# Patient Record
Sex: Male | Born: 1990 | Race: Black or African American | Hispanic: No | Marital: Single
Health system: Southern US, Academic
[De-identification: ages and names within clinical notes are randomized; demographics above are authoritative.]

## PROBLEM LIST (undated history)

## (undated) ENCOUNTER — Telehealth

## (undated) ENCOUNTER — Encounter

## (undated) ENCOUNTER — Encounter: Attending: Neurology | Primary: Neurology

## (undated) ENCOUNTER — Ambulatory Visit: Payer: MEDICARE | Attending: Sports Medicine | Primary: Sports Medicine

## (undated) ENCOUNTER — Encounter: Attending: Physician Assistant | Primary: Physician Assistant

## (undated) ENCOUNTER — Ambulatory Visit

## (undated) ENCOUNTER — Ambulatory Visit: Payer: MEDICARE | Attending: Neurology | Primary: Neurology

## (undated) ENCOUNTER — Ambulatory Visit: Payer: MEDICARE

## (undated) ENCOUNTER — Ambulatory Visit: Payer: MEDICARE | Attending: Physician Assistant | Primary: Physician Assistant

## (undated) HISTORY — PX: INGUINAL HERNIA REPAIR: SUR1180

---
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
## 2013-10-29 DIAGNOSIS — G35 Multiple sclerosis: Secondary | ICD-10-CM

## 2013-10-29 DIAGNOSIS — G35D Multiple sclerosis, unspecified: Secondary | ICD-10-CM

## 2013-10-29 HISTORY — DX: Multiple sclerosis, unspecified: G35.D

## 2013-10-29 HISTORY — DX: Multiple sclerosis: G35

## 2017-05-10 ENCOUNTER — Ambulatory Visit
Admission: RE | Admit: 2017-05-10 | Discharge: 2017-05-10 | Disposition: A | Discharge status: Home / Self Care | Payer: MEDICARE

## 2017-05-10 DIAGNOSIS — G35 Multiple sclerosis: Principal | ICD-10-CM

## 2017-05-10 MED ORDER — BUPROPION HCL XL 150 MG 24 HR TABLET, EXTENDED RELEASE
ORAL_TABLET | Freq: Every day | ORAL | 2 refills | 0 days | Status: CP
Start: 2017-05-10 — End: 2017-07-19

## 2017-05-10 NOTE — Unmapped (Signed)
[facility] [employer] [facility]  Multiple Sclerosis / Neuroimmunology Division  Osie Amparo [facility]  Physician Assistant    Phone: [phone]  Fax: [phone]  ????  Patient Name: Carlos Hodges   Date of Birth: Apr 04, 1991  Medical Record Number: [id number]  [address]  [place] [address]  ??  Direct entry by:  Cy Blamer, PA-C.  Teaching Physician: Dr. Desma Mcgregor.    DATE OF VISIT: May 10, 2017    REASON FOR VISIT: Followup in the Neuroimmunology Clinic for evaluation of relapsing remitting multiple sclerosis. Last seen 01/23/2017 by Dr. Hunt Oris.    ASSESSMENT AND PLAN:  1. Relapsing Remitting Multiple sclerosis:  - Started Gilenya 02/01/2014.   -MRI of the brain 12/31/2015 compared to 03/37/2016 shows at least 3 new non enhancing lesions in the deep subcortical white matter.  -Tysabri 02/2016 -12/2016. Rigors with every infusion despite IV Benadryl 50mg  therefore discontinued.  -Started Aubagio 02/2017.    -Check CBC/d, LFT, ,  and Vitamin D 25-OH.  -Schedule re-baseline MRI's for 6 months after the start of Aubagio, November 2018.  -Set up Monthly labs., LFT's  locally for month 4 and 5.    2. Rheumatoid arthritis: Continue follow ups with Dr. Hector Shade.   3. Performed Mini Mental exam =  30/30.  4. Depression: PHQ9= 5 . Did not start Wellbutrin or go to therapy. Encouraged to do so. Sent new prescription for Wellbutrin XL 150mg  to pharmacy. Denies suicidal ideation. Advised to monitor weight.  5.  Vit D deficiency:  Will advise once today's lab results are back.    6.  Return to clinic in 3 months.  7. Total visit time =    33  Minutes. 0225/0258  Greater than 50% of the face to face time was spent in consultation and treatment planning on the  disease process, medication, dosing and side effects. MRI images reviewed personally by myself and patient.    PRIOR HISTORY:   A 26 y.o.[place] male who is here by himself. He was seen as a new patient 09/2013 and diagnosed with relapsing Remitting Multiple sclerosis. 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    He  reported loss of vision in September 2014 on the left eye which resolved after a course of intravenous Solu-Medrol over 4 days. 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  He had spinal fluid analysis that was unremarkable.     MRI REVIEW:  02/03/2017  MRI of the brain with and without contrast compared to 12/31/2015: Similar appearance of multiple periventricular and subcortical white matter T2/FLAIR hyperintensities, compatible with known history of multiple sclerosis. No enhancing lesions are seen to suggest active demyelination. ??Similar appearance of atrophic optic nerves bilaterally.    12/31/2015  MRI of the brain compared to 01/23/2015: Redemonstration of multiple periventricular and deep subcortical white matter T2/FLAIR hyperintensities, some of which are oriented perpendicular to the callosal septal interface compatible with known history of multiple sclerosis. There are at least 3 new deep subcortical white matter lesions compared to study dated 01/23/15. No enhancing lesions to suggest active demyelination.    02/03/2017 MRI of the cervical spine with and without contrast compared to 12/31/2015: Multiple areas of T2/STIR hyperintensities in the cervical spinal cord are grossly unchanged. No new or enhancing lesions are identified.     02/03/2017 MRI of the thoracic spine with and without contrast compared to 12/31/2015: No  definite enhancing lesions are seen. Numerous foci of T2/STIR signal abnormality of the thoracic cord are mostly unchanged with no obvious new lesions seen. No significant cord atrophy is identified.    MS MEDICATION:  Gilenya started 02/01/2014.  Tysabri  02/2016- 12/2016. Had rigors with each infusion     INTERVAL HISTORY:  Continues to have vertigo with  room spinning. Occurs infrequently now. Last for ten seconds. More from sitting to standing. tion.  Trouble with focusing and forgetting things. Has not improved since graduation from college.    Denies double vision, pain with eye movement or color desaturation.  Denies  inability to empty bladder. Some urgency and some leakage. Does not wear pad or depends. No UTI's. Does not want to take medications.  Denies suicidal ideation. Some  depressionDid not start Wellbutrin or go to counseling.  Patient reports no changes in work or social history.    FAMILY HISTORY:  No MS, RA or diabetes.    Review of Systems:  A 10-systems review was performed and, unless otherwise noted, declared negative by patient.    Appointment on 01/02/2017   Component Date Value Ref Range Status   ??? Sodium 01/02/2017 144  135 - 145 mmol/L Final   ??? Potassium 01/02/2017 4.8  3.5 - 5.0 mmol/L Final   ??? Chloride 01/02/2017 101  98 - 107 mmol/L Final   ??? CO2 01/02/2017 31.3* 22.0 - 30.0 mmol/L Final   ??? BUN 01/02/2017 13  7 - 21 mg/dL Final   ??? Creatinine 01/02/2017 0.74  0.70 - 1.30 mg/dL Final   ??? BUN/Creatinine Ratio 01/02/2017 18   Final   ??? EGFR MDRD Non Af Amer 01/02/2017 130  mL/min/1.88m2 Final   ??? EGFR MDRD Af Amer 01/02/2017 157  mL/min/1.14m2 Final   ??? Anion Gap 01/02/2017 11.7  9 - 15 mmol/L Final   ??? Glucose 01/02/2017 70  65 - 179 mg/dL Final   ??? Calcium 16/07/9603 10.2  8.5 - 10.2 mg/dL Final   ??? Albumin 54/06/8118 4.6  3.5 - 5.0 g/dL Final   ??? Total Protein 01/02/2017 8.1* 6.6 - 8.0 g/dL Final   ??? Total Bilirubin 01/02/2017 0.4  0.0 - 1.2 mg/dL Final   ??? AST 14/78/2956 44  19 - 55 U/L Final   ??? ALT 01/02/2017 23  19 - 72 U/L Final   ??? Alkaline Phosphatase 01/02/2017 76  38 - 126 U/L Final   ??? WBC 01/02/2017 5.0  4.5 - 11.0 10*9/L Final   ??? RBC 01/02/2017 4.58  4.50 - 5.90 10*12/L Final   ??? HGB 01/02/2017 12.6* 13.5 - 17.5 g/dL Final   ??? HCT 21/30/8657 40.9* 41.0 - 53.0 % Final   ??? MCV 01/02/2017 89.2  80.0 - 100.0 fL Final   ??? MCH 01/02/2017 27.6  26.0 - 34.0 pg Final   ??? MCHC 01/02/2017 30.9* 31.0 - 37.0 g/dL Final   ??? RDW 84/69/6295 15.5* 12.0 - 15.0 % Final   ??? MPV 01/02/2017 7.1  7.0 - 10.0 fL Final   ??? Platelet 01/02/2017 284  150 - 440 10*9/L Final   ??? Absolute Neutrophils 01/02/2017 1.9* 2.0 - 7.5 10*9/L Final   ??? Absolute Lymphocytes 01/02/2017 2.6  1.5 - 5.0 10*9/L Final   ??? Absolute Monocytes 01/02/2017 0.2  0.2 - 0.8 10*9/L Final   ??? Absolute Eosinophils 01/02/2017 0.1  0.0 - 0.4 10*9/L Final   ??? Absolute Basophils 01/02/2017 0.1  0.0 - 0.1 10*9/L Final   ??? Large  Unstained Cells 01/02/2017 4  0 - 4 % Final   ??? Hypochromasia 01/02/2017 Marked* Not Present Final   ??? Smear Review Comments 01/02/2017 See Comment* Undefined Final   Procedure visit on 12/14/2016   Component Date Value Ref Range Status   ??? Fluid Type 12/14/2016 Fluid, Joint   Final   ??? Color, Fluid 12/14/2016 Yellow   Final   ??? Appearance, Fluid 12/14/2016 Opaque   Final   ??? RBC, Fluid 12/14/2016 15000  ul Final   ??? Nucleated Cells, Fluid 12/14/2016 65000  Undefined ul Final   ??? Neutrophil %, Fluid 12/14/2016 83.0  % Final   ??? Lymphocytes %, Fluid 12/14/2016 5.0  % Final   ??? Mono/Macro % , Fluid 12/14/2016 12.0  % Final   ??? Total Cells Counted, BF Diff 12/14/2016 100   Final   ??? Crystal Analysis 12/14/2016 No crystals seen  No crystals seen Final   ??? Fluid Comments 12/14/2016    Final   ??? Joint Fluid Culture 12/14/2016 NO GROWTH   Final   ??? Gram Stain Result 12/14/2016 1+ Polymorphonuclear leukocytes   Final   ??? Gram Stain Result 12/14/2016 No organisms seen   Final   Office Visit on 11/22/2016   Component Date Value Ref Range Status   ??? Quanterferon TB Gold 01/02/2017 Negative  Negative Final   ??? Quanterferon NIL Value 01/02/2017 0.07  IU/mL Final   ??? Quantiferon Mitogen Minus Nil 01/02/2017 >10.00  IU/mL Final   ??? Quantiferon Antigen Minus Nil 01/02/2017 -0.01  IU/mL Final       PROBLEM LIST:    Patient Active Problem List   Diagnosis   ??? Rheumatoid arthritis (CMS-HCC)   ??? Temporal arteritis (CMS-HCC)   ??? Multiple sclerosis (CMS-HCC)   ??? Risk for falls   ??? High risk medication use   ??? Rigors   ??? Pain of both elbows   ??? Bilateral knee swelling   ??? Vitamin D deficiency   ??? Pain and swelling of knee, left   ??? Disorder of bone    ??? Long term current use of non-steroidal anti-inflammatories (NSAID)       Past Surgical Hx:    Past Surgical History:   Procedure Laterality Date   ??? APPENDECTOMY     ??? ELBOW SURGERY     ??? HERNIA REPAIR         Social Hx:    Social History     Social History   ??? Marital status: Single     Spouse name: N/A   ??? Number of children: N/A   ??? Years of education: N/A     Social History Main Topics   ??? Smoking status: Never Smoker   ??? Smokeless tobacco: Never Used   ??? Alcohol use No      Comment: social   ??? Drug use: No   ??? Sexual activity: Not on file     Other Topics Concern   ??? Do You Use Sunscreen? No   ??? Tanning Bed Use? No   ??? Are You Easily Burned? No   ??? Excessive Sun Exposure? No   ??? Blistering Sunburns? No     Social History Narrative    Lives with mother.    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    School , 4 classes, M-F, full time student.       Family Hx:  No family history on file.    CURRENT MEDICATIONS:  Current Outpatient Prescriptions   Medication Sig Dispense Refill   ??? buPROPion (WELLBUTRIN) 75 MG tablet Take 1 tablet (75 mg total) by mouth Three (3) times a day. 90 tablet 2   ??? cholecalciferol, vitamin D3, 4,000 unit cap Take 4,000 Units by mouth daily. 30 capsule 5   ??? teriflunomide (AUBAGIO) 14 mg Tab Take 14 mg by mouth daily. 28 tablet 5     Current Facility-Administered Medications   Medication Dose Route Frequency Provider Last Rate Last Dose   ??? famotidine (PEPCID) injection 20 mg  20 mg Intravenous Once Abbott Pao, MD           ALLERGIES:  No Known Allergies    VITAL SIGNS  BP 112/52 (BP Site: L Arm, BP Position: Sitting, BP Cuff Size: Medium)  - Pulse 63  - Ht 182.9 cm (6')  - Wt 67.4 kg (148 lb 11.2 oz)  - BMI 20.17 kg/m??     PHYSICAL EXAMINATION:  GENERAL:  Alert and oriented to person, place, time and situation.    Recent and remote memory intact.      Neurological Examination:     Cranial Nerves:   II, III- Pupils are equal 3 mm and reactive to light b/l.  III, IV, VI- extra ocular movements are intact, No ptosis, no nystagmus.  V- sensation of the face intact b/l.  VII- face symmetrical, no facial droop, normal facial movements with smile/grimace  VIII- Hearing grossly intact.  IX and X- symmetric palate contraction, normal gag bilaterally  XI- Full shoulder shrug bilaterally  XII- Tongue protrudes midline, full range of movements of the tongue    Motor Exam:   ??  Muscles UEs ?? LEs   ?? R L ?? R L   Deltoids 5/5 5/5 Hip flexors  5/5 5/5   Biceps 5/5 5/5 Hip extensors 5/5 5/5   Triceps 5/5 5/5 Knee flexors 5/5 5/5   Hand grip 5/5 5/5 Knee extensors 5/5 5/5   Wrist flexors 5/5 5/5 Foot dorsal flexors 5/5 5/5   Wrist extensors 5/5 5/5 Foot plantar flexors 5/5 5/5   Finger flexors 5/5 5/5      Finger extensors 5/5 5/5         Normal bulk and tone.  No clonus.    Reflexes R L   Brachioradialis +2 +2   Biceps +2 +2   Triceps +2 +2   Patella +2 +2   Achilles +2 +2     Negative babinski.    Sensory UEs LEs    R L R L          Pin prick WNL WNL WNL WNL   Vibration WNL WNL WNL WNL   Proprioception WNL WNL WNL WNL        Cerebellar/Coordination:  Rapid alternating movements, finger-to-nose and heel-to-shin  bilaterally demonstrates no abnormalities.    Romberg was intact with eyes closed.  No ataxia or tremors  Noted.    Gait: Normal stride and arm swing.   Able to tandem, heel, and toe gait without difficulty.     Mini mental , MMSE = 30/30.  PHQ9= 5    EDSS   02/02/2016 Visual Acuity:  with glasses, OD 20/20, OS 20/30.  Reports  Scotoma in OS.  Confrontation WNL.  Fundoscopic exam: paledisc OS.     Functional Scores, FS.  Visual 1  Brainstem 0  Pyramidal 0  Cerebellar 0  Sensory 0  Bowel/ Bladder 0  Cerebral 1  Ambulation score  0    EDSS = 1.5.  Media Information          Document Information Pictures      05/10/2017 ??2:51 PM   Attached To:   Office Visit on 05/10/17 with Yang Rack Doreatha Massed, PA   Source Information     Johari Bennetts Doreatha Massed, [place] [facility] [place] [address]

## 2017-05-14 MED ORDER — ERGOCALCIFEROL (VITAMIN D2) 1,250 MCG (50,000 UNIT) CAPSULE
ORAL_CAPSULE | ORAL | 0 refills | 0.00000 days | Status: CP
Start: 2017-05-14 — End: 2017-07-03

## 2017-05-15 NOTE — Unmapped (Signed)
Rx Vitamin D 50.000 IU/week for 8 weeks, to be followed by 4000 Iu/day.     Carlos Hodges

## 2017-05-23 NOTE — Unmapped (Signed)
[facility] Refill and Clinical Coordination Note  Medication(s): AUBAGIO    Carlos Hodges, DOB: 1990-12-13  Phone: [phone] (home) , Alternate phone contact: N/A  Shipping address: [address]  [place] [address]  Phone or address changes today?: No  All above HIPAA information verified.  Insurance changes? No    Completed refill and clinical call assessment today to schedule patient's medication shipment from the [facility] [phone]).      MEDICATION RECONCILIATION    Confirmed the medication and dosage are correct and have not changed: Yes, regimen is correct and unchanged.    Were there any changes to your medication(s) in the past month:  No, there are no changes reported at this Hodges.    ADHERENCE    Is this medicine transplant or covered by Medicare Part B? No.      Did you miss any doses in the past 4 weeks? No missed doses reported.  Adherence counseling provided? Not needed     SIDE EFFECT MANAGEMENT    Are you tolerating your medication?:  Carlos Hodges reports tolerating the medication.  Side effect management discussed: None      Therapy is appropriate and should be continued.    Evidence of clinical benefit: See Epic note from 05/10/17      FINANCIAL/SHIPPING    Delivery Scheduled: Yes, Expected medication delivery date: 05/29/17   Additional medications refilled: No additional medications/refills needed at this Hodges.    Carlos Hodges.    Delivery address validated in [facility] scheduling system: [facility] UPDATED TO NEW ADDRESS FOR DELIVERY      We will follow up with patient monthly for standard refill processing and delivery.      Thank you,  Carlos Hodges   [facility] Specialty Pharmacist

## 2017-06-17 NOTE — Unmapped (Signed)
Specialty Pharmacy Refill Coordination Note     Carlos Hodges is a 26 y.o. male contacted today regarding refills of his specialty medication(s).    Reviewed and verified with patient:      Specialty medication(s) and dose(s) confirmed: yes  Changes to medications: no  Changes to insurance: no    Medication Adherence    Patient reported X missed doses in the last month:  0  Specialty Medication:  AUBAGIO  Patient is on additional specialty medications:  No  Informant:  patient  Confirmed plan for next specialty medication refill:  delivery by pharmacy  Medication Assistance Program  Refill Coordination  Has the Patient's Contact Information Changed:  No  Is the Shipping Address Different:  No  Shipping Information  Delivery Scheduled:  Yes  Delivery Date:  06/25/17  Medications to be Shipped:  AUBAGIO          Follow-up: 3 week(s)     Westley Gambles  Specialty Pharmacy Technician

## 2017-07-15 MED ORDER — CHOLECALCIFEROL (VITAMIN D3) 100 MCG (4,000 UNIT) CAPSULE
ORAL_CAPSULE | Freq: Every day | ORAL | 11 refills | 0.00000 days | Status: CP
Start: 2017-07-15 — End: 2017-11-27

## 2017-07-16 NOTE — Unmapped (Signed)
Called patient to schedule delivery. Patient declined.    1- Patient had to relocate due to [facility]    2- Patient reports his doctor told him that if he wants to have children, he should not take this medication. He did not want to elaborate any more than that, and he was not in a place to speak freely.    3- Transferred patient to [facility] to schedule appointment to see provider.

## 2017-07-19 ENCOUNTER — Ambulatory Visit
Admission: RE | Admit: 2017-07-19 | Discharge: 2017-07-19 | Disposition: A | Discharge status: Home / Self Care | Payer: MEDICARE | Attending: Physician Assistant | Admitting: Physician Assistant

## 2017-07-19 DIAGNOSIS — G35 Multiple sclerosis: Principal | ICD-10-CM

## 2017-07-19 LAB — CBC W/ AUTO DIFF
BASOPHILS ABSOLUTE COUNT: 0.1 10*9/L (ref 0.0–0.1)
EOSINOPHILS ABSOLUTE COUNT: 0.1 10*9/L (ref 0.0–0.4)
HEMATOCRIT: 40.2 % — ABNORMAL LOW (ref 41.0–53.0)
HEMOGLOBIN: 12.7 g/dL — ABNORMAL LOW (ref 13.5–17.5)
LARGE UNSTAINED CELLS: 4 % (ref 0–4)
LYMPHOCYTES ABSOLUTE COUNT: 2 10*9/L (ref 1.5–5.0)
MEAN CORPUSCULAR HEMOGLOBIN CONC: 31.6 g/dL (ref 31.0–37.0)
MEAN CORPUSCULAR HEMOGLOBIN: 29.2 pg (ref 26.0–34.0)
MEAN PLATELET VOLUME: 7.8 fL (ref 7.0–10.0)
MONOCYTES ABSOLUTE COUNT: 0.3 10*9/L (ref 0.2–0.8)
NEUTROPHILS ABSOLUTE COUNT: 1.7 10*9/L — ABNORMAL LOW (ref 2.0–7.5)
RED BLOOD CELL COUNT: 4.35 10*12/L — ABNORMAL LOW (ref 4.50–5.90)
RED CELL DISTRIBUTION WIDTH: 13 % (ref 12.0–15.0)
WBC ADJUSTED: 4.2 10*9/L — ABNORMAL LOW (ref 4.5–11.0)

## 2017-07-19 LAB — LARGE UNSTAINED CELLS: Lab: 4

## 2017-07-19 LAB — SMEAR REVIEW

## 2017-07-19 MED ORDER — TERIFLUNOMIDE 14 MG TABLET
ORAL_TABLET | Freq: Every day | ORAL | 1 refills | 0 days | Status: CP
Start: 2017-07-19 — End: 2017-11-27

## 2017-07-19 NOTE — Unmapped (Signed)
??  ??     In case of:  ?? a suspected relapse (new symptoms or worsening existing symptoms, lasting for >24h)  OR  ?? a need for an additional appointment for other reasons     Please contact:    [facility] Neurology [facility] Desk  Phone: [phone]      OR     Ms. Webb Laws  Administrative [facility], Department of Neurology  [address], [address]     [place], [place] [address]     Phone: [phone], Fax: [phone]           Yolande Jolly [facility]    [facility] Neurology /   Multiple Sclerosis Division    [address]    [place], [place] [address]
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
## 2017-07-22 MED ORDER — METHYLPREDNISOLONE 1GM IVPB
0 refills | 0 days | Status: CP
Start: 2017-07-22 — End: 2017-11-27

## 2017-07-29 NOTE — Unmapped (Signed)
Patient called - finished IVMP on Saturday.  Concerned that he has chest tightness and arm and leg soreness and weakness that he said he didn't have before.      He was not sure what is causing this.    Phone: [phone]

## 2017-07-29 NOTE — Unmapped (Signed)
Provider paged - Recommended urgent care or ED.    Called and relayed message to patient to go to local urgent care of ED.

## 2017-07-29 NOTE — Unmapped (Signed)
Message relayed to patient.

## 2017-08-24 NOTE — Unmapped (Signed)
No show  This encounter was created in error - please disregard.

## 2017-09-16 NOTE — Unmapped (Signed)
-----   Message from Petra Kuba, CMA sent at 09/16/2017 10:36 AM EST -----  Regarding: RE: needs appt.  No option for voicemail. Put in recall/reminder. I will try to call again tomorrow.     ----- Message -----  From: Cy Blamer, PA  Sent: 09/13/2017   1:55 PM  To: , #  Subject: needs appt.                                      Patient is past due for appointment.     Please call patient and schedule appointment for an extended 60 minute visit with Dr. Johnnye Lana.

## 2017-10-11 ENCOUNTER — Ambulatory Visit
Admission: RE | Admit: 2017-10-11 | Discharge: 2017-10-11 | Disposition: A | Discharge status: Home / Self Care | Payer: MEDICARE

## 2017-10-11 DIAGNOSIS — G35 Multiple sclerosis: Principal | ICD-10-CM

## 2017-10-14 NOTE — Unmapped (Signed)
Update:  Reviewed MRI's from 10/11/2017. New lesions seen in cervical spine and Balo's effect on brain MRI.  Patient is past due for appt. with Dr. Johnnye Lana.  Clinical flare-up 07/19/2017 and treated with IV methylprednisolone.  Currently on Aubagio.  History of Juvenile RA.  Recommend to change DMT to possibly Ocrevus.    Second ATTEMPT to reach out to patient.    Patient is past due for clinic visit.  Staff notified to call and schedule appointment and my chart message and /or letter sent to patient.    Last seen in clinic 07/19/2017. Safety labs checked 04/2017.

## 2017-10-15 NOTE — Unmapped (Signed)
Per report from Oran Rein at [facility] pharmacy, patient denied refill of Aubagio as he states he was instructed to finish his current bottle 2-3 months ago then stop taking it. Per Yolande Jolly, patient was supposed to have restarted it. Given that he has an appointment with Dr. Johnnye Lana in January and he has new lesions on his MRI, therapy may be escalated at that visit so per Clara he can remain off of Aubagio.    Worthy Flank, PharmD, CPP  Clinical Pharmacist, [facility]  Phone: [phone]

## 2017-10-15 NOTE — Unmapped (Signed)
I called patient to schedule his shipment of Aubagio per Alleen Borne, however he declined a shipment. He reports about 2 or 3 months ago his doctor advised him to finish the bottle he had, and then stop taking it. I advised patient I checked with the office before I called to schedule, and would check let them know he declined.

## 2017-11-22 ENCOUNTER — Ambulatory Visit: Admit: 2017-11-22 | Discharge: 2017-11-23 | Payer: MEDICARE

## 2017-11-22 ENCOUNTER — Ambulatory Visit: Admit: 2017-11-22 | Discharge: 2017-11-22 | Payer: MEDICARE

## 2017-11-22 DIAGNOSIS — E559 Vitamin D deficiency, unspecified: Secondary | ICD-10-CM

## 2017-11-22 DIAGNOSIS — M25521 Pain in right elbow: Secondary | ICD-10-CM

## 2017-11-22 DIAGNOSIS — M25522 Pain in left elbow: Secondary | ICD-10-CM

## 2017-11-22 DIAGNOSIS — G8929 Other chronic pain: Secondary | ICD-10-CM

## 2017-11-22 DIAGNOSIS — Z9181 History of falling: Secondary | ICD-10-CM

## 2017-11-22 DIAGNOSIS — M25421 Effusion, right elbow: Secondary | ICD-10-CM

## 2017-11-22 DIAGNOSIS — M25561 Pain in right knee: Secondary | ICD-10-CM

## 2017-11-22 DIAGNOSIS — M0579 Rheumatoid arthritis with rheumatoid factor of multiple sites without organ or systems involvement: Principal | ICD-10-CM

## 2017-11-22 DIAGNOSIS — Z791 Long term (current) use of non-steroidal anti-inflammatories (NSAID): Secondary | ICD-10-CM

## 2017-11-22 DIAGNOSIS — G35 Multiple sclerosis: Secondary | ICD-10-CM

## 2017-11-22 DIAGNOSIS — M25562 Pain in left knee: Secondary | ICD-10-CM

## 2017-11-22 MED ORDER — PREDNISONE 5 MG TABLETS IN A DOSE PACK
PACK | Freq: Every day | ORAL | 0 refills | 0.00000 days | Status: CP
Start: 2017-11-22 — End: 2017-12-23

## 2017-11-22 NOTE — Unmapped (Signed)
Subjective:       Carlos Hodges is a 27 y.o. male who was referred by Dr Renato Battles for evaluation of rheumatoid arthritis.  Symptoms have been present for several years. Onset was gradual. Symptoms include afternoon fatigue, joint pain, joint swelling and morning stiffness and are moderate in nature. Patient denies eye symptoms and skin nodules. Symptoms are made worse by: cold exposure, kneeling, overhead work and raising arm over head.  Symptoms are helped by arthritis medications and rest. Associated symptoms include arthralgia, fatigue, fevers, joint pain and morning stiffness. Patient denies associated alopecia, bleeding/clotting problems, depression, fevers, memory loss, muscle weakness, nausea, new headache, nodules, oral ulcers, palpitations, pleurisy, polydypsia, polyuria, rashes/photosensitive, Raynaud's and seizures.  Overall disease activity is characterized as ongoing.  Limitation on activities include none.     His symptoms began at age 51 when he experienced a fall at the playground and injured his left elbow.  Initially,  it was thought that he had hairline fracture.  A 1 month after the fall, the patient started experiencing swelling, pain and limitation  of range of motion of the left elbow.  A year later, he started having similar symptoms in the right elbow.   Four years later, he started experiencing intermittent symptoms involving the right knee, at the same time that his left elbows stopped hurting.  Two years later in an additive fashion again, the left knee started hurting and the right elbow stopped hurting.  The knee symptoms persisted intermittently and in 2010 had recurrence of the right elbow symptoms.  He has never had involvement of the  small joints of his fingers, wrists, shoulders and neck.  He deniesinvolvement of the toes, ankles and low back.  He was treated at [facility] with intra-articularorticosteroid injection and taper of prednisone that helped hissymptoms.  He reports 1 hour morning stiffness in the left knee. He has received nonsteroidal anti-inflammatories and several corticosteroid injections.  He was treated for 1-2 years with methotrexate that was ineffective.  He was also treated with a combination of methotrexate and Enbrel during that time for 6-9 months without improvement.  Humira plus methotrexate were also tried without improvement. A trial of tacrolimus was ineffective. The patient was last see by Dr Olean Ree in April, [id number] - then all serologies including rheumatoid factor, CCP antibodies, ANA, ENA, Lyme serologies and RMSF were negative.  Review of the MRI of the left knee revealed synovitis, effusion, but no evidence for hemosiderin deposition.  Then the patient was followed by Dr Derrek Gu (rheumatologist). He was started on Xeljanz in May, 2014 with good control of his RA and no side effects, stable labs. The patient had been off of Xeljanz in a few months (didn't get refills)  The patient was diagnosed with MS in 2014 and followed by [facility]. He is on Tysabri therapy. The patient has history of rigors with the last two infusions that treated with Benadryl and steroids. He did not have any other symptoms and his rigors resolved without any residual complaints. He feels well today.  He is up to date with his physical exams, He had negative TB test.    Recent lab results showed low Vitamin D level. His BDS test was normal in November, 2017.  The patient reports no recent infections or injuries or RA flares. He is pain free today.    The patient had arthrocentesis and left knee steroid injection by Dr Margaretmary Bayley with excellent results. Synovial fluid results were  negative for crystals.    The patient was last seen in March, 2018. He had MS flare-up on 07/19/2017 and treated with IV methylprednisolone.  He was on on Aubagio, neurology recommend to change DMT to possibly Ocrevus.    Elbow Pain: Patient complains of right elbow pain. Onset of the symptoms was several months ago. Inciting event: none known. Current symptoms include locking, pain radiating to the wrist and swelling. Pain is aggravated by: grasping, lifting heavy objects, supination/pronation as when opening doors. Patient's overall course: gradually worsening. Patient has had prior elbow problems. Evaluation to date: none. Treatment to date: avoidance of offending activity, ice and OTC analgesics.    Disease History:  Seropositive:  no  Erosive:  yes    Previous therapies: Patient has had a course of DMARDs in the past.  Disease activity: low.  Functional status: assessed this visit.  Disease prognosis: good.    Previous Report(s) Reviewed: historical medical records, lab reports, office notes, radiology reports, referral letter/letters and x-ray reports     No Known Allergies  Current Outpatient Prescriptions   Medication Sig Dispense Refill   ??? cholecalciferol, vitamin D3, (VITAMIN D3) 4,000 unit cap Take 4,000 Units by mouth daily. (Patient not taking: Reported on 07/19/2017) 30 capsule 11   ??? methylPREDNISolone sodium succinate (PF), OVERFILL 10 mL in sodium chloride 0.9% 100 mL IVPB One gram methylprednisolone daily for five days for MS flare-up. To be administered at infusion center. 5 each 0   ??? teriflunomide (AUBAGIO) 14 mg Tab Take 14 mg by mouth daily. 28 tablet 1     No current facility-administered medications for this visit.      No family history on file.  Current Outpatient Prescriptions on File Prior to Visit   Medication Sig Dispense Refill   ??? cholecalciferol, vitamin D3, (VITAMIN D3) 4,000 unit cap Take 4,000 Units by mouth daily. (Patient not taking: Reported on 07/19/2017) 30 capsule 11   ??? methylPREDNISolone sodium succinate (PF), OVERFILL 10 mL in sodium chloride 0.9% 100 mL IVPB One gram methylprednisolone daily for five days for MS flare-up. To be administered at infusion center. 5 each 0   ??? teriflunomide (AUBAGIO) 14 mg Tab Take 14 mg by mouth daily. 28 tablet 1     No current facility-administered medications on file prior to visit.      Past Medical History:   Diagnosis Date   ??? Arthritis    ??? Joint pain    ??? MS (multiple sclerosis) (CMS-HCC)    ??? Rheumatoid arthritis(714.0)      Past Surgical History:   Procedure Laterality Date   ??? APPENDECTOMY     ??? ELBOW SURGERY     ??? HERNIA REPAIR       Patient Active Problem List    Diagnosis Date Noted   ??? Long term current use of non-steroidal anti-inflammatories (NSAID) 09/18/2016   ??? Risk for falls 06/07/2016   ??? High risk medication use 06/07/2016   ??? Rigors 06/07/2016   ??? Pain of both elbows 06/07/2016   ??? Bilateral knee swelling 06/07/2016   ??? Vitamin D deficiency 06/07/2016   ??? Pain and swelling of knee, left 06/07/2016   ??? Disorder of bone  06/07/2016   ??? Multiple sclerosis (CMS-HCC) 10/27/2014   ??? Rheumatoid arthritis (CMS-HCC) 10/09/2013   ??? Temporal arteritis (CMS-HCC) 10/09/2013     Social History     Social History   ??? Marital status: Single     Spouse name: N/A   ???  Number of children: N/A   ??? Years of education: N/A     Occupational History   ??? Not on file.     Social History Main Topics   ??? Smoking status: Never Smoker   ??? Smokeless tobacco: Never Used   ??? Alcohol use No      Comment: social   ??? Drug use: No   ??? Sexual activity: Not on file     Other Topics Concern   ??? Do You Use Sunscreen? No   ??? Tanning Bed Use? No   ??? Are You Easily Burned? No   ??? Excessive Sun Exposure? No   ??? Blistering Sunburns? No     Social History Narrative    Lives with mother.    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    School , 4 classes, M-F, full time student.     There were no vitals taken for this visit.    Review of Systems  A 12 point review of systems was negative except for pertinent items noted in the HPI.      Objective:    There were no vitals taken for this visit.    General Appearance:  Alert, cooperative, no distress, appears stated age   Head:  Normocephalic, without obvious abnormality, atraumatic   Eyes:  PERRL, conjunctiva/corneas clear, EOM's intact, fundi benign, both eyes   Ears:  Normal TM's and external ear canals, both ears   Nose: Nares normal, septum midline, mucosa normal, no drainage or sinus tenderness   Throat: Lips, mucosa, and tongue normal; teeth and gums normal   Neck: Supple, symmetrical, trachea midline, no adenopathy, thyroid: not enlarged, symmetric, no tenderness/mass/nodules, no carotid bruit or JVD   Back:   Symmetric, no curvature, ROM normal, no CVA tenderness   Lungs:   Clear to auscultation bilaterally, respirations unlabored   Chest Wall:  No tenderness or deformity   Heart:  Regular rate and rhythm, S1, S2 normal, no murmur, rub or gallop   Abdomen:   Soft, non-tender, bowel sounds active all four quadrants,  no masses, no organomegaly   Genitalia:  Normal male   Musculosceletal:  Small joint of the hands and feet without synovitis. Full ROM of the spine, hands, feet, hips. Right elbow effusion with limited flexion and extension.   Extremities: Extremities normal, atraumatic, no cyanosis or edema   Pulses: 2+ and symmetric   Skin: Skin color, texture, turgor normal, no rashes or lesions   Lymph nodes: Cervical, supraclavicular, and axillary nodes normal   Neurologic: Normal         Imaging    MRI of the T spine: Stable thoracic cord T2 signal abnormalities compared to study dated 01/23/15 consistent with known history of multiple sclerosis. No new or enhancing lesions.    Grossly unchanged white matter burden compared to dated 01/23/15. More conspicuous lesion within the dorsal lateral aspect of the cervical spinal cord at C3. Finding might be related to differences in technique. No new or enhancing lesions.      Lab Review    LFTS normal  CBC stable     Assessment:     Patient Active Problem List    Diagnosis Date Noted   ??? Long term current use of non-steroidal anti-inflammatories (NSAID) 09/18/2016   ??? Risk for falls 06/07/2016   ??? High risk medication use 06/07/2016   ??? Rigors 06/07/2016   ??? Pain of both elbows 06/07/2016   ??? Bilateral knee swelling 06/07/2016   ???  Vitamin D deficiency 06/07/2016   ??? Pain and swelling of knee, left 06/07/2016   ??? Disorder of bone  06/07/2016   ??? Multiple sclerosis (CMS-HCC) 10/27/2014   ??? Rheumatoid arthritis (CMS-HCC) 10/09/2013   ??? Temporal arteritis (CMS-HCC) 10/09/2013     Plan:     Orders Placed This Encounter   Procedures   ??? XR Elbow 3 Or More Views Bilateral     Standing Status:   Future     Number of Occurrences:   1     Standing Expiration Date:   11/22/2018     Order Specific Question:   Performed at     Answer:   [facility]   ??? Ambulatory referral to Orthopedic Surgery     Standing Status:   Future     Standing Expiration Date:   11/22/2018     Referral Priority:   Routine     Referral Type:   Generic Referral     Referral Reason:   Specialty Services Required     Referred to Provider:   Benjaman Kindler, MD     Number of Visits Requested:   1     Medications:continue present medication, discussed risks (side effects) and benefits of medication & encouraged to read about medication. We may need to restart Harriette Ohara therapy - we discussed potential side effects.  We discussed NSAID and steroid side effects.  Labs:  None.  Procedures: Bursa aspiration and injection not indicated at this time. and Joint aspiration and injection not indicated today.  Imaging: elbow x-rays.  Avoid heavy lifting.  Continue with Vitamin D supplementation.  Follow up with Neurology.  Referrals: Orthopedic consult for the right elbow pain and synovitis.  PT, massage, water therapy.  Discussion and guidance: Discussed importance of taking medication as directed, Discussed pros and cons and risks of joint injections, Handout given on rheumatoid arthritis, Handout given on diet and exercise, injury and fall prevention, Recommended rest for painful and swollen joints, Recommended regular exercise program once pain and swelling is decreased.  Follow-up In 3 months.  Call if worsening or not improving.  The patient does not have a documented glucocorticoid management plan within the past 12 months.

## 2017-11-22 NOTE — Unmapped (Signed)
Preventing Falls: Care Instructions  Your Care Instructions    Getting around your home safely can be a challenge if you have injuries or health problems that make it easy for you to fall. Loose rugs and furniture in walkways are among the dangers for many older people who have problems walking or who have poor eyesight. People who have conditions such as arthritis, osteoporosis, or dementia also have to be careful not to fall.  You can make your home safer with a few simple measures.  Follow-up care is a key part of your treatment and safety. Be sure to make and go to all appointments, and call your doctor if you are having problems. It's also a good idea to know your test results and keep a list of the medicines you take.  How can you care for yourself at home?  Taking care of yourself  ?? You may get dizzy if you do not drink enough water. To prevent dehydration, drink plenty of fluids, enough so that your urine is light yellow or clear like water. Choose water and other caffeine-free clear liquids. If you have kidney, heart, or liver disease and have to limit fluids, talk with your doctor before you increase the amount of fluids you drink.  ?? Exercise regularly to improve your strength, muscle tone, and balance. Walk if you can. Swimming may be a good choice if you cannot walk easily.  ?? Have your vision and hearing checked each year or any time you notice a change. If you have trouble seeing and hearing, you might not be able to avoid objects and could lose your balance.  ?? Know the side effects of the medicines you take. Ask your doctor or pharmacist whether the medicines you take can affect your balance. Sleeping pills or sedatives can affect your balance.  ?? Limit the amount of alcohol you drink. Alcohol can impair your balance and other senses.  ?? Ask your doctor whether calluses or corns on your feet need to be removed. If you wear loose-fitting shoes because of calluses or corns, you can lose your balance and fall.  ?? Talk to your doctor if you have numbness in your feet.  Preventing falls at home  ?? Remove raised doorway thresholds, throw rugs, and clutter. Repair loose carpet or raised areas in the floor.  ?? Move furniture and electrical cords to keep them out of walking paths.  ?? Use nonskid floor wax, and wipe up spills right away, especially on ceramic tile floors.  ?? If you use a walker or cane, put rubber tips on it. If you use crutches, clean the bottoms of them regularly with an abrasive pad, such as steel wool.  ?? Keep your house well lit, especially stairways, porches, and outside walkways. Use night-lights in areas such as hallways and bathrooms. Add extra light switches or use remote switches (such as switches that go on or off when you clap your hands) to make it easier to turn lights on if you have to get up during the night.  ?? Install sturdy handrails on stairways.  ?? Move items in your cabinets so that the things you use a lot are on the lower shelves (about waist level).  ?? Keep a cordless phone and a flashlight with new batteries by your bed. If possible, put a phone in each of the main rooms of your house, or carry a cell phone in case you fall and cannot reach a phone. Or, you can wear  a device around your neck or wrist. You push a button that sends a signal for help.  ?? Wear low-heeled shoes that fit well and give your feet good support. Use footwear with nonskid soles. Check the heels and soles of your shoes for wear. Repair or replace worn heels or soles.  ?? Do not wear socks without shoes on wood floors.  ?? Walk on the grass when the sidewalks are slippery. If you live in an area that gets snow and ice in the winter, sprinkle salt on slippery steps and sidewalks.  Preventing falls in the bath  ?? Install grab bars and nonskid mats inside and outside your shower or tub and near the toilet and sinks.  ?? Use shower chairs and bath benches.  ?? Use a hand-held shower head that will allow you to sit while showering.  ?? Get into a tub or shower by putting the weaker leg in first. Get out of a tub or shower with your strong side first.  ?? Repair loose toilet seats and consider installing a raised toilet seat to make getting on and off the toilet easier.  ?? Keep your bathroom door unlocked while you are in the shower.  Where can you learn more?  Go to [facility] at [email or web address]-fletcher.info/.  Select Preferences in the upper right hand corner, then select Health Library under Resources. Enter G117 in the search box to learn more about Preventing Falls: Care Instructions.  Current as of: January 10, 2017  Content Version: 11.9  ?? 2006-2018 [employer]. Care instructions adapted under license by [facility]. If you have questions about a medical condition or this instruction, always ask your healthcare professional. [employer] disclaims any warranty or liability for your use of this information.

## 2017-11-27 ENCOUNTER — Ambulatory Visit: Admit: 2017-11-27 | Discharge: 2017-11-28 | Payer: MEDICARE | Attending: Neurology | Primary: Neurology

## 2017-11-27 DIAGNOSIS — G35 Multiple sclerosis: Principal | ICD-10-CM

## 2017-11-27 DIAGNOSIS — M1990 Unspecified osteoarthritis, unspecified site: Secondary | ICD-10-CM

## 2017-11-27 DIAGNOSIS — R892 Abnormal level of other drugs, medicaments and biological substances in specimens from other organs, systems and tissues: Secondary | ICD-10-CM

## 2017-11-27 DIAGNOSIS — E559 Vitamin D deficiency, unspecified: Secondary | ICD-10-CM

## 2017-11-27 MED ORDER — CHOLECALCIFEROL (VITAMIN D3) 100 MCG (4,000 UNIT) CAPSULE
ORAL_CAPSULE | Freq: Every day | ORAL | 11 refills | 0.00000 days | Status: CP
Start: 2017-11-27 — End: 2018-05-23

## 2017-11-28 NOTE — Unmapped (Signed)
[facility] at [facility]  Multiple Sclerosis/Neuroimmunology Division  Sarita Bottom, MD  Associate Professor of Neurology    DATE OF VISIT: 11/27/2017    Re:  Carlos Hodges  [address]  [place] [address]  MRN: [id number]  DOB: [birth year]      Direct entry by: Dr. Sarita Bottom    Visit: First Visit      REASON FOR VISIT: Carlos Hodges, a 26 y.o. African American right handed male, is seen in consultation at the [facility], Multiple Sclerosis/Neuroimmunology Division for the evaluation of multiple sclerosis.    Carlos Hodges was last time seen at the  at the [facility], Multiple Sclerosis/Neuroimmunology Division by Yolande Jolly, PA, on 07/19/2017.    Assessment:     1. I took a detailed history of the present illness fromMr. Carlos Hodges , details on past medical history, family history and social history.  2. I personally reviewed  patient's prior medical records, radiology reports, and laboratory work.    3. I personally reviewed the patient???s prior MRI images  and have discussed  MRI findings with the patient.   4.  I performed neurological examination.  7. The differential diagnosis and the plan for the diagnostic work-up were discussed in details with the patient. Carlos Hodges agreed with the recommended diagnostic plan  8. Potential treatment options have been  discussed with Carlos Hodges, who agreed with the recommended treatment plan.                                                                                                                                                  ?? Multiple sclerosis:  Carlos Hodges, is a 27 y.o. 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  His disease course is RR and disease evolution since 06/2013. His 09/2017 cervical MRI shows new lesions, compared to 01/2017, indicating disease activity. The patient discontinued Aubagio in 06/2017 due to the pregnancy plans of his partner. I Rx Cholestyramine for the rapid elimination of Aubagio, and would recommend Rituximab as the next treatment option, to treat both MS and arthritis. The patient agreed. Would discuss this plan with rheumatology. Please see the detailed plan below.     ?? Juvenile arthritis/RA:  Followed at rheumatology.  Per 11/22/2017 note by Dr. Hector Shade He has received nonsteroidal anti-inflammatories and several corticosteroid injections. ??He was treated for 1-2 years with methotrexate that was ineffective. ??He was also treated with a combination of methotrexate and Enbrel during that time for 6-9 months without improvement. ??Humira plus methotrexate were also tried without  improvement. A trial of tacrolimus was ineffective. The patient was last see by Dr Olean Ree in April, 2011 - then all serologies including rheumatoid factor, CCP antibodies, ANA, ENA, Lyme serologies and RMSF were negative. ??Review of the MRI of the left knee revealed synovitis, effusion, but no evidence for hemosiderin deposition.  Then the patient was followed by Dr Derrek Gu (rheumatologist). He was started on Xeljanz in May, 2014 with good control of his RA and no side effects, stable labs. The patient had been off of Xeljanz in a few months (didn't get refills). Rheumatology is currently prescribing steroids.   ?? Vitamin D deficiency:  Continue Vitamin D 4000 IU/day.     Plan:     1. CBC/diff, CMP, NMO-IgG, HBs Ag, HBc Ab, HCV Ab, HIV, quantiferon: To do the labs ordered today with the PCP (lab closed at the end of this visit).    2. recommend a flu shot.  3.  We discussed to start on Rituximab, to treat both RA and MS. We will schedule it if no contraindications (after receiving ordered lab results): 1000 mg IV on day 1, 1000 mg IV on Day 15, followed by 1000 mg IV every 6 months. It can be started 6 weeks after he would  receive  a flu shot. Get a feedback on this treatment  strategy from Dr. Hector Shade.  4. Continue with Vitamin D 4000 IU/day.  5. Rx cholestyramine for a rapid elimination of Aubagio: the patient's partner has pregnancy plans  6. Follow-up wiith Clara Zelasky 3 months after the start of Rituximab, or earlier, if needed. Consider urology referral.   7. Follow-up with the PCP. To please evaluate  Depression with the PCP.       Subjective:     CHIEF COMPLAINTS: bladder control problems, mild cognitive problems, vertigo ???on and off???, anxiety, depression, loss of appetite    HISTORY OF PRESENT ILLNESS:  Carlos Hodges, is a 27 y.o. African American right handed male with a history of MS (multiple sclerosis) and RA.     His neurological symptoms started in  September 2014, when he got left hand numbness and left optic neuritis, Received IV steroids and his vision returned but not completely. Has bladder control problems over the last 4-5 years (urgency, hesitancy, false alarms). Has not seen urologist. Has mild cognitive problems, cognitive fatigue and sometimes forgets things. Denies headaches. Has vertigo ???on and off???, tingling on and off. He reports that in the last year he had two flares for which he received IV steroids- around 11/2016 and 06/2017, for both he stated that he had numbness in legs and arms and leg weakness, he stated that he received steroids both times and recovered. He has anxiety, depression, loss of appetite, over the past year. Works from home, but has short breaks that are not enough for him and asks if he could get a letter stating that he needs more breaks during the day work time.   He does not take Vitamin D although it was prescribed.   MS DMD history:  Gilenya started 02/01/2014. MRI of the brain 12/31/2015 compared to 03/37/2016 shows at least 3 new non enhancing lesions in the deep subcortical white matter.  Tysabri  02/2016- 12/2016. Had ???rigors??? with each infusion therefore discontinued.  Aubagio  02/2017- 06/2017- stopped because he would like to have a  child soon. He is off MS DMD drugs since then.    Please see below the results of the diagnostic  work-up performed so far.   ............................................................................................................................................Marland Kitchen  DIAGNOSTIC STUDIES / REVIEW OF RECORDS:    MRI:  10/11/2017: Brain MRI with and w/o contrast, report (COMPARISON: MRI brain 02/03/2017, 12/31/2015): -Multiple white matter lesions in a distribution consistent with multiple sclerosis as well as diffuse T2 plateau pallor and global atrophy with questionable Ballo's sclerosis lesion in the right vertex. No enhancing lesion.-Bilateral optic nerve atrophy, similar to prior  10/11/2017: C spine MRI with and w/o contrast, report(COMPARISON: Cervical spine MRI 02/03/2017 as well as additional priors): New and increased size of multiple T2 hyperintense lesions throughout the cervical spinal cord. No enhancing lesions.  10/11/2017: T spine MRI with and w/o contrast, report (COMPARISON: Thoracic spine MRI 02/03/2017 as well as additional priors): -No significant change in multiple thoracic spinal cord lesions. No definitive new or enhancing lesions.  Lumbar puncture:  Per notes by Dr. Joanne Gavel on 10/05/2013 he had spinal fluid analysis that was unremarkable. No results available fo my review.     Blood tests:     Office Visit on 07/19/2017   Component Date Value Ref Range Status   ??? WBC 07/19/2017 4.2* 4.5 - 11.0 10*9/L Final   ??? RBC 07/19/2017 4.35* 4.50 - 5.90 10*12/L Final   ??? HGB 07/19/2017 12.7* 13.5 - 17.5 g/dL Final   ??? HCT 16/07/9603 40.2* 41.0 - 53.0 % Final   ??? MCV 07/19/2017 92.5  80.0 - 100.0 fL Final   ??? MCH 07/19/2017 29.2  26.0 - 34.0 pg Final   ??? MCHC 07/19/2017 31.6  31.0 - 37.0 g/dL Final   ??? RDW 54/06/8118 13.0  12.0 - 15.0 % Final   ??? MPV 07/19/2017 7.8  7.0 - 10.0 fL Final   ??? Platelet 07/19/2017 480* 150 - 440 10*9/L Final   ??? Absolute Neutrophils 07/19/2017 1.7* 2.0 - 7.5 10*9/L Final   ??? Absolute Lymphocytes 07/19/2017 2.0  1.5 - 5.0 10*9/L Final   ??? Absolute Monocytes 07/19/2017 0.3  0.2 - 0.8 10*9/L Final   ??? Absolute Eosinophils 07/19/2017 0.1  0.0 - 0.4 10*9/L Final   ??? Absolute Basophils 07/19/2017 0.1  0.0 - 0.1 10*9/L Final   ??? Large Unstained Cells 07/19/2017 4  0 - 4 % Final   ??? Hypochromasia 07/19/2017 Marked* Not Present Final   ??? Smear Review Comments 07/19/2017 See Comment* Undefined Final   Office Visit on 05/10/2017   Component Date Value Ref Range Status   ??? Albumin 05/10/2017 4.6  3.5 - 5.0 g/dL Final   ??? Total Protein 05/10/2017 7.5  6.5 - 8.3 g/dL Final   ??? Total Bilirubin 05/10/2017 0.7  0.0 - 1.2 mg/dL Final   ??? Bilirubin, Direct 05/10/2017 0.30  0.00 - 0.40 mg/dL Final   ??? AST 14/78/2956 78* 19 - 55 U/L Final   ??? ALT 05/10/2017 40  19 - 72 U/L Final   ??? Alkaline Phosphatase 05/10/2017 61  38 - 126 U/L Final   ??? Vitamin D Total (25OH) 05/10/2017 13.0* 20.0 - 80.0 ng/mL Final   ??? Results Verified by Slide Scan 05/10/2017 Slide Reviewed   Final   ??? WBC 05/10/2017 3.1* 4.5 - 11.0 10*9/L Final   ??? RBC 05/10/2017 4.48* 4.50 - 5.90 10*12/L Final   ??? HGB 05/10/2017 13.3* 13.5 - 17.5 g/dL Final   ??? HCT 21/30/8657 41.0  41.0 - 53.0 % Final   ??? MCV 05/10/2017 91.5  80.0 - 100.0 fL Final   ??? MCH 05/10/2017 29.7  26.0 - 34.0  pg Final   ??? MCHC 05/10/2017 32.5  31.0 - 37.0 g/dL Final   ??? RDW 14/78/2956 14.8  12.0 - 15.0 % Final   ??? MPV 05/10/2017 7.6  7.0 - 10.0 fL Final   ??? Platelet 05/10/2017 289  150 - 440 10*9/L Final   ??? Neutrophil Left Shift 05/10/2017 2+* Not Present Final   ??? Absolute Neutrophils 05/10/2017 1.0* 2.0 - 7.5 10*9/L Final   ??? Absolute Lymphocytes 05/10/2017 1.7  1.5 - 5.0 10*9/L Final   ??? Absolute Monocytes 05/10/2017 0.1* 0.2 - 0.8 10*9/L Final   ??? Absolute Eosinophils 05/10/2017 0.2  0.0 - 0.4 10*9/L Final   ??? Absolute Basophils 05/10/2017 0.1  0.0 - 0.1 10*9/L Final   ??? Large Unstained Cells 05/10/2017 3  0 - 4 % Final   ??? Hypochromasia 05/10/2017 Slight* Not Present Final   Appointment on 01/02/2017   Component Date Value Ref Range Status   ??? Sodium 01/02/2017 144  135 - 145 mmol/L Final   ??? Potassium 01/02/2017 4.8  3.5 - 5.0 mmol/L Final   ??? Chloride 01/02/2017 101  98 - 107 mmol/L Final   ??? CO2 01/02/2017 31.3* 22.0 - 30.0 mmol/L Final   ??? BUN 01/02/2017 13  7 - 21 mg/dL Final   ??? Creatinine 01/02/2017 0.74  0.70 - 1.30 mg/dL Final   ??? BUN/Creatinine Ratio 01/02/2017 18   Final   ??? EGFR MDRD Non Af Amer 01/02/2017 130  mL/min/1.53m2 Final   ??? EGFR MDRD Af Amer 01/02/2017 157  mL/min/1.24m2 Final   ??? Anion Gap 01/02/2017 11.7  9 - 15 mmol/L Final   ??? Glucose 01/02/2017 70  65 - 179 mg/dL Final   ??? Calcium 21/30/8657 10.2  8.5 - 10.2 mg/dL Final   ??? Albumin 84/69/6295 4.6  3.5 - 5.0 g/dL Final   ??? Total Protein 01/02/2017 8.1* 6.6 - 8.0 g/dL Final   ??? Total Bilirubin 01/02/2017 0.4  0.0 - 1.2 mg/dL Final   ??? AST 28/41/3244 44  19 - 55 U/L Final   ??? ALT 01/02/2017 23  19 - 72 U/L Final   ??? Alkaline Phosphatase 01/02/2017 76  38 - 126 U/L Final   ??? WBC 01/02/2017 5.0  4.5 - 11.0 10*9/L Final   ??? RBC 01/02/2017 4.58  4.50 - 5.90 10*12/L Final   ??? HGB 01/02/2017 12.6* 13.5 - 17.5 g/dL Final   ??? HCT 10/31/7251 40.9* 41.0 - 53.0 % Final   ??? MCV 01/02/2017 89.2  80.0 - 100.0 fL Final   ??? MCH 01/02/2017 27.6  26.0 - 34.0 pg Final   ??? MCHC 01/02/2017 30.9* 31.0 - 37.0 g/dL Final   ??? RDW 66/44/0347 15.5* 12.0 - 15.0 % Final   ??? MPV 01/02/2017 7.1  7.0 - 10.0 fL Final   ??? Platelet 01/02/2017 284  150 - 440 10*9/L Final   ??? Absolute Neutrophils 01/02/2017 1.9* 2.0 - 7.5 10*9/L Final   ??? Absolute Lymphocytes 01/02/2017 2.6  1.5 - 5.0 10*9/L Final   ??? Absolute Monocytes 01/02/2017 0.2  0.2 - 0.8 10*9/L Final   ??? Absolute Eosinophils 01/02/2017 0.1  0.0 - 0.4 10*9/L Final   ??? Absolute Basophils 01/02/2017 0.1  0.0 - 0.1 10*9/L Final   ??? Large Unstained Cells 01/02/2017 4  0 - 4 % Final   ??? Hypochromasia 01/02/2017 Marked* Not Present Final   ??? Smear Review Comments 01/02/2017 See Comment* Undefined Final     .............................................................................................................................................    Past  Medical History:  Past Medical History:   Diagnosis Date   ??? Arthritis    ??? Joint pain    ??? MS (multiple sclerosis) (CMS-HCC)    ??? Rheumatoid arthritis(714.0)        ALLERGIES:  No Known Allergies    CURRENT MEDICATIONS:    Current Outpatient Prescriptions   Medication Sig Dispense Refill   ??? predniSONE (DELTASONE) 5 mg DsPk Take by mouth daily. Taper from 30 mg down to zero. 1 Package 0   ??? cholecalciferol, vitamin D3, (VITAMIN D3) 4,000 unit cap Take 4,000 Units by mouth daily. 30 capsule 11   ??? cholestyramine-aspartame 4 gram Powd Take 8 g by mouth Three (3) times a day. Over 11 days. 264 g 0     No current facility-administered medications for this visit.            Past Surgical History:    Past Surgical History:   Procedure Laterality Date   ??? APPENDECTOMY     ??? ELBOW SURGERY     ??? HERNIA REPAIR         Social History:    Social History     Social History   ??? Marital status: Single     Spouse name: N/A   ??? Number of children: N/A   ??? Years of education: N/A     Social History Main Topics   ??? Smoking status: Never Smoker   ??? Smokeless tobacco: Never Used   ??? Alcohol use No      Comment: social   ??? Drug use: No   ??? Sexual activity: Not Asked     Other Topics Concern   ??? Do You Use Sunscreen? No   ??? Tanning Bed Use? No   ??? Are You Easily Burned? No   ??? Excessive Sun Exposure? No   ??? Blistering Sunburns? No     Social History Narrative    Lives with mother.    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    School , 4 classes, M-F, full time student.       Family History:  No family history on file.     Review of Systems:  A 10-systems review was performed and, unless otherwise noted, declared negative by patient.    Objective:     Physical Exam:  Blood pressure 123/63, pulse 52, height 182.9 cm (6' 0.01), weight 68.8 kg (151 lb 9.6 oz).   General Appearance: in no acute distress. Normal skin color, afebrile.  Heart and lungs: Regular heart rate. Eupneic, normal respiratory rate. Abdomen: Soft, non-tender.No peripheral  edema, peripheral pulses palpable.       NEUROLOGICAL EXAMINATION:     General:  Alert and oriented to person, place, time and situation.    Recent and remote memory intact.    Attention span and concentration normal.    Language and spontaneous speech normal, no dysarthria or aphasia. Naming/fluency/repetition intact.  Fund of knowledge normal.  Following lateralizing commands across midline.       Cranial Nerves:     II, III- Pupils are equal and reactive to light b/l (direct and consensual reactions). Visual Acuity: R 20/20,L 20/50 with Glasses. No visual field defect. Fundoscopy: clear optic disc margins, bilateral optic disc pallor.   III, IV, VI- extra ocular movements are intact, No ptosis, no diplopia, no nystagmus.  V- sensation of the face intact b/l.   VII- face symmetrical, no facial droop, normal facial movements with smile/grimace  VIII-  Hearing grossly intact. Weber test: sound is symmetrical with no lateralization.  IX and X- symmetric palate contraction, normal gag bilaterally, no dysarthria, no dysphagia.  XI- Full shoulder shrug bilaterally; no wasting, normal tone and strength of sternocleidomastoid muscles bilaterally.  XII- No tongue atrophy, no tongue fasciculations; tongue protrudes midline, full range of movements of the tongue.    Neck flexion normal.    Motor Exam:     Normal bulk. Fasciculations not observed.     Muscle strength:    Muscles UEs  LEs    R L  R L   Deltoids 5/5 5/5 Hip flexors  5/5 5/5   Biceps 5/5 5/5 Hip extensors 5/5 5/5   Triceps 5/5 5/5 Knee flexors 5/5 5/5   Hand grip 5/5 5/5 Knee extensors 5/5 5/5   Wrist flexors 5/5 5/5 Foot dorsal flexors 5/5 5/5   Wrist extensors 5/5 5/5 Foot plantar flexors 5/5 5/5   Finger flexors 5/5 5/5      Finger extensors 5/5 5/5          Reflexes R L   Biceps +2 +2   Brachioradialis  +2 +2   Triceps +2 +2   Patella +2 +2   Achilles +2 +2     Normal tone b/l. Negative Babinski sign bilaterally.    Sensory system:  ? Superficial light touch sensation:WNl  ? Vibration sense:mild decrease in vibrations in both legs  ? Position sense: WNL  ? Pinprick test for pain sensation:WNL  (WNL= within normal limits; UE= upper extremities; LE= lower extremities;       R= right, L= left).    Cerebellar/Coordination:  Rapid alternating movements, finger-to-nose and heel-to-shin  bilaterally demonstrate no abnormalities. Mild left leg ataxia, no gait ataxia. Romberg negative. Performs a tandem gait.    Gait: Normal stride, base and  armswing. Able to walk on toes, heels without difficulty.       .........................................................................................................................................Marland Kitchen    VISIT SUMMARY:  Carlos [facility]. Damonie Ellenwood, a 27 y.o. male  presented for the evaluation of MS. Carlos Hodges. Jeramey Lanuza is instructed to schedule a follow-up with Yolande Jolly, PA 3 months after the start of Rituximab, or earlier, if needed.   Carlos [facility]ges. Maxton Noreen [facility] voiced a complete understanding of the diagnostic and treatment plan as detailed above. All questions were answered.   Start of Visit Time: 17:01h  End of Visit Time: 17:56h  Total visit time =  55  Minutes+ additional 10 minutes for my MRI review: total visit time 65 minutes.     Greater than 50% of the face to face time was spent in consultation on the  disease process, and treatment planning, medication, dosing and side effects.     Thank you for the opportunity to contribute to the care of Carlos Hodges. [facility].

## 2017-11-28 NOTE — Unmapped (Addendum)
1) we wold recommend a flu shot.  2) We discussed to start on Rituximab, to treat both RA and MS. We will schedule it if no contraindications. It can be started 6 weeks after you would receive  a flu shot.   3) Please do the labs ordered today with your PCP   4) we will send you a prescription for a pill for a rapid elimination of Aubagio  5) Please continue with Vitamin D 4000 IU/day.   6)  Please schedule with Clara Zelasky 3 months after the start of Rituximab, or earlier, if needed.     ?   In case of:  ? a suspected relapse (new symptoms or worsening existing symptoms, lasting for >24h)  OR  ? a need for an additional appointment for other reasons     Please contact:    [facility]   Phone: [phone]           Sarita Bottom, MD  Clinical Associate Professor of Neurology  [facility], Department of Neurology  Multiple Sclerosis/Neuroimmunology Division  [address] [place], [place], [place] [address]

## 2017-12-01 MED ORDER — CHOLESTYRAMINE-ASPARTAME 4 GRAM ORAL POWDER
Freq: Three times a day (TID) | ORAL | 0 refills | 0.00000 days | Status: CP
Start: 2017-12-01 — End: 2018-05-23

## 2017-12-05 ENCOUNTER — Ambulatory Visit: Admit: 2017-12-05 | Discharge: 2017-12-06 | Payer: MEDICARE

## 2017-12-05 DIAGNOSIS — M05721 Rheumatoid arthritis with rheumatoid factor of right elbow without organ or systems involvement: Principal | ICD-10-CM

## 2017-12-05 DIAGNOSIS — M19029 Primary osteoarthritis, unspecified elbow: Secondary | ICD-10-CM

## 2017-12-05 DIAGNOSIS — M05722 Rheumatoid arthritis with rheumatoid factor of left elbow without organ or systems involvement: Secondary | ICD-10-CM

## 2017-12-05 NOTE — Unmapped (Signed)
Clinic location: [place]     ASSESSMENT:       ICD-10-CM   1. 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  Follow-up with Dr. Margaretmary Bayley to have intra-articular steroid injections of bilateral elbows for some dramatic relief.  Follow-up with me in clinic for persistent or worsening symptoms.     SUBJECTIVE:     HPI: Carlos Hodges is a 27 y.o. male RHD with RA and MS complaining of bilateral elbow pain.  Established patient of Dr. Hector Shade and arthritic symptoms are treated with Aleve as needed, and had a recent short course of prednisone which has helped most of his swelling.  However symptoms have persisted.  Most recent exacerbation 2-3 weeks.  Is previously had surgery on bilateral elbows, sounds like I&D.    Is seen in consultation at the request of Dr. Hector Shade for swelling of the right elbow and rheumatoid arthritis.     Past Medical History:   Diagnosis Date   ??? Arthritis    ??? Joint pain    ??? MS (multiple sclerosis) (CMS-HCC)    ??? Rheumatoid arthritis(714.0)       Past Surgical History:   Procedure Laterality Date   ??? APPENDECTOMY     ??? ELBOW SURGERY     ??? HERNIA REPAIR        No Known Allergies   Medications   Current Outpatient Prescriptions on File Prior to Visit   Medication Sig   ??? cholecalciferol, vitamin D3, (VITAMIN D3) 4,000 unit cap Take 4,000 Units by mouth daily.   ??? cholestyramine-aspartame 4 gram Powd Take 8 g by mouth Three (3) times a day. Over 11 days.   ??? predniSONE (DELTASONE) 5 mg DsPk Take by mouth daily. Taper from 30 mg down to zero.     No current facility-administered medications on file prior to visit.       No family history on file.   Social History     Social History Narrative    Lives alone in [place].    Disabled due to RA and MS    School , 4 classes, M-F, full time student.    Enjoys reading     Social History   Substance Use Topics   ??? Smoking status: Never Smoker   ??? Smokeless tobacco: Never Used   ??? Alcohol use No      Comment: social      Review of Systems  Review of systems was negative except for pertinent items noted in the HPI and the below:  .       PHYSICAL EXAM:     Pain: 0/10.  Vitals:    12/05/17 0906   Pulse: 53   Temp: 36.8 ??C (98.2 ??F)   TempSrc: Oral   SpO2: 93%   Weight: 67.1 kg (147 lb 14.4 oz)      General: Well developed, well nourished, in no acute distress.   Psych: Alert & oriented x 3.  ENT: Hearing normal  Neuro: Sensation intact to bilateral upper extremities  Cardiovascular: Cap refill brisk.  Moderate right elbow effusion.  Mild left elbow effusion.  Respiratory: No evidence of respiratory distress  MSK: Right upper extremity: Elbow range of motion 0-90, hard stop.  Mildly TTP diffuse  lateral aspect of the elbow.  Nontender olecranon and medial epicondyle.  Positive EPL, okay, interosseous.  Wrist range of motion equal, full, painless.  Strength 5/5.  Left upper extremity: Elbow range of motion equal, full, painless.  Positive EPL, okay, interosseous.  Nontender to palpation medial, lateral epicondyles, olecranon.  Wrist range of motion equal, full, painless.  Skin: Incisions bilateral elbows well-healed, no surrounding erythema or drainage.     MEDICAL DECISION MAKING:  Reviewed x-rays of the bilateral elbows from 11/22/2017.  Moderate to severe degenerative changes right elbow, mild to moderate degenerative changes left elbow.  No repeat x-rays today.     cc:  [facility]    Lavonda Jumbo, MD    Carlos Hodges M. Haskel Khan, FNP-BC  [phone] (main)

## 2017-12-05 NOTE — Unmapped (Signed)
Joint Injections: Care Instructions  Your Care Instructions    Joint injections are shots into a joint, such as the knee. They may be used to put in medicines, such as pain relievers.  A corticosteroid, or steroid, shot is used to reduce inflammation in tendons or joints. It is often used to treat problems such as arthritis, tendinitis, and bursitis.  Steroids can be injected directly into a painful, inflamed joint. They can also help reduce inflammation of a bursa. A bursa is a sac of fluid. It cushions and lubricates areas where tendons, ligaments, skin, muscles, or bones rub against each other.  A steroid shot can sometimes help with short-term pain relief when other treatments haven't worked. If steroid shots help, pain may improve for weeks or months.  Follow-up care is a key part of your treatment and safety. Be sure to make and go to all appointments, and call your doctor if you are having problems. It's also a good idea to know your test results and keep a list of the medicines you take.  How can you care for yourself at home?  ?? Put ice or a cold pack on the area for 10 to 20 minutes at a time. Put a thin cloth between the ice and your skin.  ?? Ask your doctor if you can take an over-the-counter pain medicine, such as acetaminophen (Tylenol), ibuprofen (Advil, Motrin), or naproxen (Aleve). Be safe with medicines. Read and follow all instructions on the label.  ?? Avoid strenuous activities for several days. In particular, avoid ones that put stress on the area where you got the shot.  ?? If you have dressings over the area, keep them clean and dry. You may remove them when your doctor tells you to.  When should you call for help?  Call your doctor now or seek immediate medical care if:  ?? ?? You have signs of infection, such as:  ? Increased pain, swelling, warmth, or redness.  ? Red streaks leading from the site.  ? Pus draining from the site.  ? A fever.   ??Watch closely for changes in your health, and be sure to contact your doctor if you have any problems.  Where can you learn more?  Go to [facility] at [email or web address]  Select Preferences in the upper right hand corner, then select Health Library under Resources. Enter N616 in the search box to learn more about Joint Injections: Care Instructions.  Current as of: July 18, 2017  Content Version: 11.9  ?? 2006-2018 [employer]. Care instructions adapted under license by [facility]. If you have questions about a medical condition or this instruction, always ask your healthcare professional. [employer] disclaims any warranty or liability for your use of this information.

## 2017-12-23 MED ORDER — PREDNISONE 5 MG TABLETS IN A DOSE PACK
PACK | Freq: Every day | ORAL | 0 refills | 0 days | Status: CP
Start: 2017-12-23 — End: 2018-04-21

## 2018-01-06 ENCOUNTER — Ambulatory Visit: Admit: 2018-01-06 | Discharge: 2018-01-06 | Payer: MEDICARE

## 2018-01-06 DIAGNOSIS — M0579 Rheumatoid arthritis with rheumatoid factor of multiple sites without organ or systems involvement: Secondary | ICD-10-CM

## 2018-01-06 DIAGNOSIS — M25561 Pain in right knee: Secondary | ICD-10-CM

## 2018-01-06 DIAGNOSIS — G35 Multiple sclerosis: Principal | ICD-10-CM

## 2018-01-06 DIAGNOSIS — E559 Vitamin D deficiency, unspecified: Secondary | ICD-10-CM

## 2018-01-06 DIAGNOSIS — M25461 Effusion, right knee: Secondary | ICD-10-CM

## 2018-01-06 DIAGNOSIS — M25521 Pain in right elbow: Secondary | ICD-10-CM

## 2018-01-06 DIAGNOSIS — M25562 Pain in left knee: Secondary | ICD-10-CM

## 2018-01-06 DIAGNOSIS — G8929 Other chronic pain: Secondary | ICD-10-CM

## 2018-01-06 DIAGNOSIS — D899 Disorder involving the immune mechanism, unspecified: Secondary | ICD-10-CM

## 2018-01-06 DIAGNOSIS — Z791 Long term (current) use of non-steroidal anti-inflammatories (NSAID): Secondary | ICD-10-CM

## 2018-01-06 DIAGNOSIS — Z79899 Other long term (current) drug therapy: Secondary | ICD-10-CM

## 2018-01-06 DIAGNOSIS — M25522 Pain in left elbow: Secondary | ICD-10-CM

## 2018-01-06 DIAGNOSIS — M25462 Effusion, left knee: Secondary | ICD-10-CM

## 2018-01-06 DIAGNOSIS — Z9181 History of falling: Secondary | ICD-10-CM

## 2018-01-06 LAB — CBC W/ AUTO DIFF
BASOPHILS ABSOLUTE COUNT: 0 10*9/L (ref 0.0–0.1)
BASOPHILS RELATIVE PERCENT: 0.9 %
EOSINOPHILS RELATIVE PERCENT: 2 %
HEMATOCRIT: 38.7 % — ABNORMAL LOW (ref 41.0–53.0)
HEMOGLOBIN: 12.2 g/dL — ABNORMAL LOW (ref 13.5–17.5)
LARGE UNSTAINED CELLS: 2 % (ref 0–4)
LYMPHOCYTES ABSOLUTE COUNT: 1.6 10*9/L (ref 1.5–5.0)
LYMPHOCYTES RELATIVE PERCENT: 45.7 %
MEAN CORPUSCULAR HEMOGLOBIN: 28.6 pg (ref 26.0–34.0)
MEAN CORPUSCULAR VOLUME: 91 fL (ref 80.0–100.0)
MEAN PLATELET VOLUME: 6.9 fL — ABNORMAL LOW (ref 7.0–10.0)
MONOCYTES ABSOLUTE COUNT: 0.1 10*9/L — ABNORMAL LOW (ref 0.2–0.8)
MONOCYTES RELATIVE PERCENT: 3.6 %
NEUTROPHILS ABSOLUTE COUNT: 1.6 10*9/L — ABNORMAL LOW (ref 2.0–7.5)
NEUTROPHILS RELATIVE PERCENT: 45.7 %
PLATELET COUNT: 309 10*9/L (ref 150–440)
RED BLOOD CELL COUNT: 4.25 10*12/L — ABNORMAL LOW (ref 4.50–5.90)
RED CELL DISTRIBUTION WIDTH: 14.3 % (ref 12.0–15.0)
WBC ADJUSTED: 3.6 10*9/L — ABNORMAL LOW (ref 4.5–11.0)

## 2018-01-06 LAB — COMPREHENSIVE METABOLIC PANEL
ALBUMIN: 4.6 g/dL (ref 3.5–5.0)
ALKALINE PHOSPHATASE: 63 U/L (ref 38–126)
ALT (SGPT): 18 U/L — ABNORMAL LOW (ref 19–72)
ANION GAP: 6.1 mmol/L — ABNORMAL LOW (ref 9–15)
AST (SGOT): 37 U/L (ref 19–55)
BLOOD UREA NITROGEN: 11 mg/dL (ref 7–21)
BUN / CREAT RATIO: 14
CALCIUM: 10.3 mg/dL — ABNORMAL HIGH (ref 8.5–10.2)
CHLORIDE: 103 mmol/L (ref 98–107)
CO2: 31.9 mmol/L — ABNORMAL HIGH (ref 22.0–30.0)
CREATININE: 0.78 mg/dL (ref 0.70–1.30)
EGFR MDRD AF AMER: 147 mL/min/{1.73_m2}
EGFR MDRD NON AF AMER: 121 mL/min/{1.73_m2}
GLUCOSE RANDOM: 68 mg/dL (ref 65–179)
POTASSIUM: 4.1 mmol/L (ref 3.5–5.0)
PROTEIN TOTAL: 8.2 g/dL (ref 6.5–8.3)
SODIUM: 141 mmol/L (ref 135–145)

## 2018-01-06 LAB — RHEUMATOID FACTOR: Rheumatoid factor:ACnc:Pt:Ser/Plas:Qn:: <8.6

## 2018-01-06 LAB — ANION GAP: Anion gap 3:SCnc:Pt:Ser/Plas:Qn:: 6.1 — ABNORMAL LOW

## 2018-01-06 LAB — SLIDE REVIEW

## 2018-01-06 LAB — SMEAR REVIEW

## 2018-01-06 LAB — C-REACTIVE PROTEIN: C reactive protein:MCnc:Pt:Ser/Plas:Qn:: 23.6 — ABNORMAL HIGH

## 2018-01-06 LAB — HEMOGLOBIN: Lab: 12.2 — ABNORMAL LOW

## 2018-01-06 MED ORDER — PREDNISONE 10 MG TABLET
ORAL_TABLET | Freq: Every day | ORAL | 0 refills | 0 days | Status: CP
Start: 2018-01-06 — End: 2018-05-23

## 2018-01-06 NOTE — Unmapped (Signed)
Subjective:       Carlos Hodges is a 27 y.o. male who was referred by Dr Renato Battles for evaluation of rheumatoid arthritis.  Symptoms have been present for several years. Onset was gradual. Symptoms include afternoon fatigue, joint pain, joint swelling and morning stiffness and are moderate in nature. Patient denies eye symptoms and skin nodules. Symptoms are made worse by: cold exposure, kneeling, overhead work and raising arm over head.  Symptoms are helped by arthritis medications and rest. Associated symptoms include arthralgia, fatigue, fevers, joint pain and morning stiffness. Patient denies associated alopecia, bleeding/clotting problems, depression, fevers, memory loss, muscle weakness, nausea, new headache, nodules, oral ulcers, palpitations, pleurisy, polydypsia, polyuria, rashes/photosensitive, Raynaud's and seizures.  Overall disease activity is characterized as ongoing.  Limitation on activities include none.     His symptoms began at age 83 when he experienced a fall at the playground and injured his left elbow.  Initially,  it was thought that he had hairline fracture.  A 1 month after the fall, the patient started experiencing swelling, pain and limitation  of range of motion of the left elbow.  A year later, he started having similar symptoms in the right elbow.   Four years later, he started experiencing intermittent symptoms involving the right knee, at the same time that his left elbows stopped hurting.  Two years later in an additive fashion again, the left knee started hurting and the right elbow stopped hurting.  The knee symptoms persisted intermittently and in 2010 had recurrence of the right elbow symptoms.  He has never had involvement of the  small joints of his fingers, wrists, shoulders and neck.  He deniesinvolvement of the toes, ankles and low back.  He was treated at [facility] with intra-articularorticosteroid injection and taper of prednisone that helped hissymptoms.  He reports 1 hour morning stiffness in the left knee. He has received nonsteroidal anti-inflammatories and several corticosteroid injections.  He was treated for 1-2 years with methotrexate that was ineffective.  He was also treated with a combination of methotrexate and Enbrel during that time for 6-9 months without improvement.  Humira plus methotrexate were also tried without improvement. A trial of tacrolimus was ineffective. The patient was last see by Dr Olean Ree in April, [id number] - then all serologies including rheumatoid factor, CCP antibodies, ANA, ENA, Lyme serologies and RMSF were negative.  Review of the MRI of the left knee revealed synovitis, effusion, but no evidence for hemosiderin deposition.  Then the patient was followed by Dr Derrek Gu (rheumatologist). He was started on Xeljanz in May, 2014 with good control of his RA and no side effects, stable labs. The patient had been off of Xeljanz in a few months (didn't get refills)  The patient was diagnosed with MS in 2014 and followed by [facility]. He is on Tysabri therapy. The patient has history of rigors with the last two infusions that treated with Benadryl and steroids. He did not have any other symptoms and his rigors resolved without any residual complaints. He feels well today.  He is up to date with his physical exams, He had negative TB test.    Recent lab results showed low Vitamin D level. His BDS test was normal in November, 2017.  The patient reports no recent infections or injuries or RA flares. He is pain free today.    The patient had arthrocentesis and left knee steroid injection by Dr Margaretmary Bayley with excellent results. Synovial fluid results were  negative for crystals.    The patient was last seen in March, 2018. He had MS flare-up on 07/19/2017 and treated with IV methylprednisolone.  He was on on Aubagio, neurology recommend to change treatment to Rituximab.    Elbow Pain: Patient complains of right elbow pain. Onset of the symptoms was several months ago. Inciting event: none known. Current symptoms include locking, pain radiating to the wrist and swelling. Pain is aggravated by: grasping, lifting heavy objects, supination/pronation as when opening doors. Patient's overall course: gradually improving. Patient has had prior elbow problems. Evaluation to date: none. Treatment to date: avoidance of offending activity, ice and OTC analgesics.  Ortho recommended US guided steroid injection.    He finished 2 steroid tapers and his elbows are doing a lot better. Today he reports right knee swelling.    Disease History:  Seropositive:  no  Erosive:  yes    Previous therapies: Patient has had a course of DMARDs in the past.  Disease activity: low.  Functional status: assessed this visit.  Disease prognosis: good.    Previous Report(s) Reviewed: historical medical records, lab reports, office notes, radiology reports, referral letter/letters and x-ray reports     No Known Allergies  Current Outpatient Prescriptions   Medication Sig Dispense Refill   ??? cholecalciferol, vitamin D3, (VITAMIN D3) 4,000 unit cap Take 4,000 Units by mouth daily. 30 capsule 11   ??? cholestyramine-aspartame 4 gram Powd Take 8 g by mouth Three (3) times a day. Over 11 days. 264 g 0   ??? predniSONE (DELTASONE) 5 mg DsPk Take by mouth daily. Taper from 30 mg down to zero. 1 Package 0     No current facility-administered medications for this visit.      History reviewed. No pertinent family history.  Current Outpatient Prescriptions on File Prior to Visit   Medication Sig Dispense Refill   ??? cholecalciferol, vitamin D3, (VITAMIN D3) 4,000 unit cap Take 4,000 Units by mouth daily. 30 capsule 11   ??? cholestyramine-aspartame 4 gram Powd Take 8 g by mouth Three (3) times a day. Over 11 days. 264 g 0   ??? predniSONE (DELTASONE) 5 mg DsPk Take by mouth daily. Taper from 30 mg down to zero. 1 Package 0     No current facility-administered medications on file prior to visit.      Past Medical History:   Diagnosis Date   ??? Arthritis    ??? Joint pain    ??? MS (multiple sclerosis) (CMS-HCC)    ??? Rheumatoid arthritis(714.0)      Past Surgical History:   Procedure Laterality Date   ??? APPENDECTOMY     ??? ELBOW SURGERY     ??? HERNIA REPAIR       Patient Active Problem List    Diagnosis Date Noted   ??? Immunosuppression (CMS-HCC) 12/01/2017   ??? Drug toxicity 12/01/2017   ??? Swelling of right elbow 11/22/2017   ??? Long term current use of non-steroidal anti-inflammatories (NSAID) 09/18/2016   ??? Risk for falls 06/07/2016   ??? High risk medication use 06/07/2016   ??? Rigors 06/07/2016   ??? Pain of both elbows 06/07/2016   ??? Bilateral knee swelling 06/07/2016   ??? Vitamin D deficiency 06/07/2016   ??? Chronic pain of both knees 06/07/2016   ??? Disorder of bone  06/07/2016   ??? Multiple sclerosis (CMS-HCC) 10/27/2014   ??? Rheumatoid arthritis (CMS-HCC) 10/09/2013   ??? Temporal arteritis (CMS-HCC) 10/09/2013     Social History  Social History   ??? Marital status: Single     Spouse name: N/A   ??? Number of children: N/A   ??? Years of education: N/A     Occupational History   ??? Not on file.     Social History Main Topics   ??? Smoking status: Never Smoker   ??? Smokeless tobacco: Never Used   ??? Alcohol use No      Comment: social   ??? Drug use: No   ??? Sexual activity: Not on file     Other Topics Concern   ??? Do You Use Sunscreen? No   ??? Tanning Bed Use? No   ??? Are You Easily Burned? No   ??? Excessive Sun Exposure? No   ??? Blistering Sunburns? No     Social History Narrative    Lives alone in [place].    Disabled due to RA and MS    School , 4 classes, M-F, full time student.    Enjoys reading     BP 120/60  - Temp 37.3 ??C (99.2 ??F) (Oral)  - Ht 182.9 cm (6' 0.01)  - Wt 64.4 kg (142 lb 1 oz)  - BMI 19.26 kg/m??     Review of Systems  A 12 point review of systems was negative except for pertinent items noted in the HPI.      Objective:    BP 120/60  - Temp 37.3 ??C (99.2 ??F) (Oral)  - Ht 182.9 cm (6' 0.01)  - Wt 64.4 kg (142 lb 1 oz)  - BMI 19.26 kg/m??     General Appearance:  Alert, cooperative, no distress, appears stated age   Head:  Normocephalic, without obvious abnormality, atraumatic   Eyes:  PERRL, conjunctiva/corneas clear, EOM's intact, fundi benign, both eyes   Ears:  Normal TM's and external ear canals, both ears   Nose: Nares normal, septum midline, mucosa normal, no drainage or sinus tenderness   Throat: Lips, mucosa, and tongue normal; teeth and gums normal   Neck: Supple, symmetrical, trachea midline, no adenopathy, thyroid: not enlarged, symmetric, no tenderness/mass/nodules, no carotid bruit or JVD   Back:   Symmetric, no curvature, ROM normal, no CVA tenderness   Lungs:   Clear to auscultation bilaterally, respirations unlabored   Chest Wall:  No tenderness or deformity   Heart:  Regular rate and rhythm, S1, S2 normal, no murmur, rub or gallop   Abdomen:   Soft, non-tender, bowel sounds active all four quadrants,  no masses, no organomegaly   Genitalia:  Normal male   Musculosceletal:  Small joint of the hands and feet without synovitis. Full ROM of the spine, hands, feet, hips. Right elbow effusion with limited flexion and extension.   Extremities: Extremities normal, atraumatic, no cyanosis or edema   Pulses: 2+ and symmetric   Skin: Skin color, texture, turgor normal, no rashes or lesions   Lymph nodes: Cervical, supraclavicular, and axillary nodes normal   Neurologic: Normal         Imaging    MRI of the T spine: Stable thoracic cord T2 signal abnormalities compared to study dated 01/23/15 consistent with known history of multiple sclerosis. No new or enhancing lesions.    Grossly unchanged white matter burden compared to dated 01/23/15. More conspicuous lesion within the dorsal lateral aspect of the cervical spinal cord at C3. Finding might be related to differences in technique. No new or enhancing lesions.      Lab Review    LFTS  normal  CBC stable     Assessment:     Patient Active Problem List Diagnosis Date Noted   ??? Immunosuppression (CMS-HCC) 12/01/2017   ??? Drug toxicity 12/01/2017   ??? Swelling of right elbow 11/22/2017   ??? Long term current use of non-steroidal anti-inflammatories (NSAID) 09/18/2016   ??? Risk for falls 06/07/2016   ??? High risk medication use 06/07/2016   ??? Rigors 06/07/2016   ??? Pain of both elbows 06/07/2016   ??? Bilateral knee swelling 06/07/2016   ??? Vitamin D deficiency 06/07/2016   ??? Chronic pain of both knees 06/07/2016   ??? Disorder of bone  06/07/2016   ??? Multiple sclerosis (CMS-HCC) 10/27/2014   ??? Rheumatoid arthritis (CMS-HCC) 10/09/2013   ??? Temporal arteritis (CMS-HCC) 10/09/2013     Plan:     Orders Placed This Encounter   Procedures   ??? C-reactive protein     Standing Status:   Future     Number of Occurrences:   1     Standing Expiration Date:   01/06/2019   ??? Rheumatoid Factor, Quantitative     Standing Status:   Future     Number of Occurrences:   1     Standing Expiration Date:   01/07/2019   ??? Cyclic Citrul Peptide Antibody, IgG     Standing Status:   Future     Number of Occurrences:   1     Standing Expiration Date:   01/06/2019   ??? CBC w/ Differential     Standing Status:   Future     Number of Occurrences:   1     Standing Expiration Date:   01/06/2019   ??? Comprehensive Metabolic Panel     Standing Status:   Future     Number of Occurrences:   1     Standing Expiration Date:   01/06/2019     Order Specific Question:   Is this a fasting order?     Answer:   No   ??? Quantiferon TB Gold Plus     Standing Status:   Future     Number of Occurrences:   1     Standing Expiration Date:   01/07/2019     Medications:continue present medication, discussed risks (side effects) and benefits of medication & encouraged to read about medication. We may need to restart Harriette Ohara therapy - we discussed potential side effects.  We discussed NSAID and steroid side effects.  Labs:  See orders.  TB test.  Procedures: Bursa aspiration and injection not indicated at this time. and Joint aspiration and injection not indicated today.  Imaging: none.  Avoid heavy lifting.  Continue with Vitamin D supplementation.  Follow up with Neurology. Agree with Rituximab therapy for MS and RA. I provided information about Rituximab.  Referrals: Follow up with Orthopedic clinic for the right elbow synovitis, consider US guided steroid injection?  PT, massage, water therapy.  Discussion and guidance: Discussed importance of taking medication as directed, Discussed pros and cons and risks of joint injections, Handout given on rheumatoid arthritis, Handout given on diet and exercise, injury and fall prevention, Recommended rest for painful and swollen joints, Recommended regular exercise program once pain and swelling is decreased.  Follow-up In 3 months.  Call if worsening or not improving.  I have spent 25 minutes in consultation with the patient, of that time, I have spent more than 50% discussing his diagnosis, treatment options and coordination of her care.  The  patient does not have a documented glucocorticoid management plan within the past 12 months.

## 2018-01-06 NOTE — Unmapped (Addendum)
Preventing Falls: Care Instructions  Your Care Instructions    Getting around your home safely can be a challenge if you have injuries or health problems that make it easy for you to fall. Loose rugs and furniture in walkways are among the dangers for many older people who have problems walking or who have poor eyesight. People who have conditions such as arthritis, osteoporosis, or dementia also have to be careful not to fall.  You can make your home safer with a few simple measures.  Follow-up care is a key part of your treatment and safety. Be sure to make and go to all appointments, and call your doctor if you are having problems. It's also a good idea to know your test results and keep a list of the medicines you take.  How can you care for yourself at home?  Taking care of yourself  ?? You may get dizzy if you do not drink enough water. To prevent dehydration, drink plenty of fluids, enough so that your urine is light yellow or clear like water. Choose water and other caffeine-free clear liquids. If you have kidney, heart, or liver disease and have to limit fluids, talk with your doctor before you increase the amount of fluids you drink.  ?? Exercise regularly to improve your strength, muscle tone, and balance. Walk if you can. Swimming may be a good choice if you cannot walk easily.  ?? Have your vision and hearing checked each year or any time you notice a change. If you have trouble seeing and hearing, you might not be able to avoid objects and could lose your balance.  ?? Know the side effects of the medicines you take. Ask your doctor or pharmacist whether the medicines you take can affect your balance. Sleeping pills or sedatives can affect your balance.  ?? Limit the amount of alcohol you drink. Alcohol can impair your balance and other senses.  ?? Ask your doctor whether calluses or corns on your feet need to be removed. If you wear loose-fitting shoes because of calluses or corns, you can lose your balance and fall.  ?? Talk to your doctor if you have numbness in your feet.  Preventing falls at home  ?? Remove raised doorway thresholds, throw rugs, and clutter. Repair loose carpet or raised areas in the floor.  ?? Move furniture and electrical cords to keep them out of walking paths.  ?? Use nonskid floor wax, and wipe up spills right away, especially on ceramic tile floors.  ?? If you use a walker or cane, put rubber tips on it. If you use crutches, clean the bottoms of them regularly with an abrasive pad, such as steel wool.  ?? Keep your house well lit, especially stairways, porches, and outside walkways. Use night-lights in areas such as hallways and bathrooms. Add extra light switches or use remote switches (such as switches that go on or off when you clap your hands) to make it easier to turn lights on if you have to get up during the night.  ?? Install sturdy handrails on stairways.  ?? Move items in your cabinets so that the things you use a lot are on the lower shelves (about waist level).  ?? Keep a cordless phone and a flashlight with new batteries by your bed. If possible, put a phone in each of the main rooms of your house, or carry a cell phone in case you fall and cannot reach a phone. Or, you can wear  a device around your neck or wrist. You push a button that sends a signal for help.  ?? Wear low-heeled shoes that fit well and give your feet good support. Use footwear with nonskid soles. Check the heels and soles of your shoes for wear. Repair or replace worn heels or soles.  ?? Do not wear socks without shoes on wood floors.  ?? Walk on the grass when the sidewalks are slippery. If you live in an area that gets snow and ice in the winter, sprinkle salt on slippery steps and sidewalks.  Preventing falls in the bath  ?? Install grab bars and nonskid mats inside and outside your shower or tub and near the toilet and sinks.  ?? Use shower chairs and bath benches.  ?? Use a hand-held shower head that will allow you to sit while showering.  ?? Get into a tub or shower by putting the weaker leg in first. Get out of a tub or shower with your strong side first.  ?? Repair loose toilet seats and consider installing a raised toilet seat to make getting on and off the toilet easier.  ?? Keep your bathroom door unlocked while you are in the shower.  Where can you learn more?  Go to [facility] at [email or web address]  Select Preferences in the upper right hand corner, then select Health Library under Resources. Enter G117 in the search box to learn more about Preventing Falls: Care Instructions.  Current as of: January 10, 2017  Content Version: 11.9  ?? 2006-2018 [employer]. Care instructions adapted under license by [facility]. If you have questions about a medical condition or this instruction, always ask your healthcare professional. [employer], Incorporated disclaims any warranty or liability for your use of this information.         Elbow: Exercises  Your Care Instructions  Here are some examples of exercises for elbows. Start each exercise slowly. Ease off the exercise if you start to have pain.  Your doctor or physical therapist will tell you when you can start these exercises and which ones will work best for you.  How to do the exercises  Wrist flexor stretch    1. Extend your arm in front of you with your palm up.  2. Bend your wrist, pointing your hand toward the floor.  3. With your other hand, gently bend your wrist farther until you feel a mild to moderate stretch in your forearm.  4. Hold for at least 15 to 30 seconds. Repeat 2 to 4 times.    Wrist extensor stretch    1. Repeat steps 1 to 4 of the stretch above but begin with your extended hand palm down.    Ball or sock squeeze    1. Hold a tennis ball (or a rolled-up sock) in your hand.  2. Make a fist around the ball (or sock) and squeeze.  3. Hold for about 6 seconds, and then relax for up to 10 seconds.  4. Repeat 8 to 12 times.  5. Switch the ball (or sock) to your other hand and do 8 to 12 times.    Wrist deviation    1. Sit so that your arm is supported but your hand hangs off the edge of a flat surface, such as a table.  2. Hold your hand out like you are shaking hands with someone.  3. Move your hand up and down.  4. Repeat this motion 8 to 12 times.  5. Switch  arms.  6. Try to do this exercise twice with each hand.    Wrist curls    1. Place your forearm on a table with your hand hanging over the edge of the table, palm up.  2. Place a 1- to 2-pound weight in your hand. This may be a dumbbell, a can of food, or a filled water bottle.  3. Slowly raise and lower the weight while keeping your forearm on the table and your palm facing up.  4. Repeat this motion 8 to 12 times.  5. Switch arms, and do steps 1 through 4.  6. Repeat with your hand facing down toward the floor. Switch arms.    Biceps curls    1. Sit leaning forward with your legs slightly spread and your left hand on your left thigh.  2. Place your right elbow on your right thigh, and hold the weight with your forearm horizontal.  3. Slowly curl the weight up and toward your chest.  4. Repeat this motion 8 to 12 times.  5. Switch arms, and do steps 1 through 4.    Follow-up care is a key part of your treatment and safety. Be sure to make and go to all appointments, and call your doctor if you are having problems. It's also a good idea to know your test results and keep a list of the medicines you take.  Where can you learn more?  Go to [facility] at [email or web address]  Select Preferences in the upper right hand corner, then select Health Library under Resources. Enter M279 in the search box to learn more about Elbow: Exercises.  Current as of: July 18, 2017  Content Version: 11.9  ?? 2006-2018 [employer] Inc[employer]. Care instructions adapted under license by [facility]. If you have questions about a medical condition or this instruction, always ask your healthcare professional. [employer], Incorporated disclaims any warranty or liability for your use of this information.           rituximab  Pronunciation:  ri TUX i mab  Brand:  Rituxan  What is the most important information I should know about rituximab?  Rituximab may cause a serious brain infection that can lead to disability or death. Call your doctor right away if you have problems with speech, thought, vision, or muscle movement. These symptoms may start gradually and get worse quickly.  Tell your doctor if you have ever had hepatitis B. Rituximab can cause this condition to come back or get worse.  Severe skin problems can also occur during treatment with rituximab. Call your doctor if you have painful skin or mouth sores, or a severe skin rash with blistering, peeling, or pus.  Some side effects may occur during the injection or within 24 hours afterward. Tell your caregiver right away  if you feel dizzy, weak, light-headed, short of breath, or if you have chest pain, wheezing, sudden cough, or pounding heartbeats or fluttering in your chest.  What is rituximab?  Rituximab is a cancer medicine that interferes with the growth and spread of cancer cells in the body.  Rituximab is used alone or in combination with other medicines to treat the following conditions in adults:  ?? non-Hodgkin's lymphoma or chronic lymphocytic leukemia;  ?? rheumatoid arthritis;  ?? certain rare disorders that cause inflammation of blood vessels and other tissues in the body; or  ?? a severe autoimmune reaction that causes blisters and breakdown of the skin and mucous membranes.  Rituximab may also be  used for purposes not listed in this medication guide.  What should I discuss with my healthcare provider before receiving rituximab?  Rituximab may cause a serious brain infection that can lead to disability or death. This infection may be more likely if have used an immunosuppressant drug in the past, or if you have received rituximab with a stem cell transplant.  You should not be treated with rituximab if you are allergic to it.  Tell your doctor if you have ever had:  ?? liver disease or hepatitis (or if you are a carrier of hepatitis B);  ?? kidney disease;  ?? lung disease or a breathing disorder;  ?? a weak immune system (caused by disease or by using certain medicines);  ?? an active infection, including herpes, shingles, cytomegalovirus, chickenpox, parvovirus, West Nile virus, or hepatitis B or C;  ?? heart disease, angina (chest pain), or heart rhythm disorder; or  ?? if you have used rituximab in the past.  Do not use rituximab if you are pregnant. It could harm the unborn baby. Use effective birth control to prevent pregnancy while you are using this medicine and for at least 12 months after your last dose.  It is not safe to breast-feed a baby while you are using this medicine.  Also do not breast-feed for at least 6 months after your last dose.  How is rituximab given?  Your doctor will perform blood tests to make sure you do not have conditions that would prevent you from safely using rituximab.  Rituximab is given as an infusion into a vein. A healthcare provider will give you this injection.  Rituximab is not given daily. Your schedule will depend on the condition being treated. Follow your doctor's dosing instructions very carefully.  Before each injection, you may be given other medications to prevent certain side effects of rituximab.  You will need frequent medical tests.  If you've ever had hepatitis B, using rituximab can cause this virus to become active or get worse. You may need frequent liver function tests while using this medicine and for several months after you stop.  If you need surgery, tell the surgeon ahead of time that you are using rituximab.  What happens if I miss a dose?  Call your doctor if you miss an appointment for your rituximab injection.  What happens if I overdose?  Since this medication is given by a healthcare professional in a medical setting, an overdose is unlikely to occur.  What should I avoid while receiving rituximab?  Do not receive a live vaccine while using rituximab, and avoid coming into contact with anyone who has recently received a live vaccine. There is a chance that the virus could be passed on to you. Live vaccines include measles, mumps, rubella (MMR), rotavirus, typhoid, yellow fever, varicella (chickenpox), zoster (shingles), and nasal flu (influenza) vaccine.  What are the possible side effects of rituximab?  Get emergency medical help if you have signs of an allergic reaction: hives; chest tightness, trouble breathing; swelling of your face, lips, tongue, or throat.  Some side effects may occur during the injection (or within 24 hours afterward). Tell your caregiver right away  if you feel itchy, dizzy, weak, light-headed, short of breath, or if you have chest pain, wheezing, sudden cough, or pounding heartbeats or fluttering in your chest.  Rituximab may cause a serious brain infection that can lead to disability or death. Call your doctor right away if you have any of the following  symptoms (which may start gradually and get worse quickly):  ?? confusion, memory problems, or other changes in your mental state;  ?? weakness on one side of your body;  ?? vision changes; or  ?? problems with speech or walking.  Call your doctor at once if you have any of these other side effects, even if they occur several months after you receive rituximab, or after your treatment ends.  ?? fever, chills, cold or flu symptoms, cough, sore throat, headache, earache;  ?? pain or burning when you urinate;  ?? painful skin or mouth sores, or a severe skin rash with blistering, peeling, or pus;  ?? redness, warmth, or swelling of the skin;  ?? severe stomach pain, vomiting, constipation, bloody or tarry stools;  ?? chest pain, irregular heartbeats;  ?? dark urine, or jaundice (yellowing of the skin or eyes);  ?? low red blood cells (anemia) --pale skin, unusual tiredness, feeling light-headed or short of breath, cold hands and feet; or  ?? signs of tumor cell breakdown --confusion, weakness, muscle cramps, nausea, vomiting, fast or slow heart rate, decreased urination, tingling in your hands and feet or around your mouth.  Common side effects may include:  ?? fever, chills, body aches, anemia, infections;  ?? nausea, diarrhea;  ?? swelling in your hands or feet;  ?? feeling tired;  ?? joint pain, muscle spasms; or  ?? cold symptoms such as stuffy nose, sneezing, sore throat.  This is not a complete list of side effects and others may occur. Call your doctor for medical advice about side effects. You may report side effects to FDA at 1-800-FDA-1088.  What other drugs will affect rituximab?  Tell your doctor about all your other medicines, especially:  ?? medicines to treat rheumatoid arthritis --adalimumab, certolizumab, etanercept, golimumab, infliximab, leflunomide, sulfasalazine.  This list is not complete. Other drugs may affect rituximab, including prescription and over-the-counter medicines, vitamins, and herbal products. Not all possible drug interactions are listed here.  Where can I get more information?  Your doctor or pharmacist can provide more information about rituximab.    Remember, keep this and all other medicines out of the reach of children, never share your medicines with others, and use this medication only for the indication prescribed.  Every effort has been made to ensure that the information provided by [employer]') is accurate, up-to-date, and complete, but no guarantee is made to that effect. Drug information contained herein may be time sensitive. [employer] information has been compiled for use by healthcare practitioners and consumers in the [place] and therefore [employer] does not warrant that uses outside of the [place] are appropriate, unless specifically indicated otherwise. [employer]'s drug information does not endorse drugs, diagnose patients or recommend therapy. [employer]'s drug information is an [employer] corporate to assist licensed healthcare practitioners in caring for their patients and/or to serve consumers viewing this service as a supplement to, and not a substitute for, the expertise, skill, knowledge and judgment of healthcare practitioners. The absence of a warning for a given drug or drug combination in no way should be construed to indicate that the drug or drug combination is safe, effective or appropriate for any given patient. [employer] does not assume any responsibility for any aspect of healthcare administered with the aid of information [employer] provides. The information contained herein is not intended to cover all possible uses, directions, precautions, warnings, drug interactions, allergic reactions, or adverse effects. If you have questions about the drugs you  are taking, check with your doctor, nurse or pharmacist.  Copyright [phone] [employer] Version: 12.01. Revision date: 07/02/2017.  Care instructions adapted under license by [facility]. If you have questions about a medical condition or this instruction, always ask your healthcare professional. Healthwise, Incorporated disclaims any warranty or liability for your use of this information.

## 2018-01-08 LAB — CCP ANTIBODIES: Lab: <1

## 2018-01-09 LAB — QUANTIFERON TB GOLD PLUS
QUANTIFERON ANTIGEN 1 MINUS NIL: 0.01 [IU]/mL
QUANTIFERON ANTIGEN 2 MINUS NIL: -0.01 [IU]/mL

## 2018-01-09 LAB — QUANTIFERON MITOGEN: Lab: 9.68

## 2018-04-21 ENCOUNTER — Ambulatory Visit: Admit: 2018-04-21 | Discharge: 2018-04-21 | Payer: MEDICARE

## 2018-04-21 ENCOUNTER — Ambulatory Visit: Admit: 2018-04-21 | Discharge: 2018-04-22 | Payer: MEDICARE

## 2018-04-21 DIAGNOSIS — M25561 Pain in right knee: Secondary | ICD-10-CM

## 2018-04-21 DIAGNOSIS — Z9181 History of falling: Secondary | ICD-10-CM

## 2018-04-21 DIAGNOSIS — M25562 Pain in left knee: Secondary | ICD-10-CM

## 2018-04-21 DIAGNOSIS — D899 Disorder involving the immune mechanism, unspecified: Secondary | ICD-10-CM

## 2018-04-21 DIAGNOSIS — G35 Multiple sclerosis: Secondary | ICD-10-CM

## 2018-04-21 DIAGNOSIS — M25461 Effusion, right knee: Secondary | ICD-10-CM

## 2018-04-21 DIAGNOSIS — M0579 Rheumatoid arthritis with rheumatoid factor of multiple sites without organ or systems involvement: Principal | ICD-10-CM

## 2018-04-21 DIAGNOSIS — Z791 Long term (current) use of non-steroidal anti-inflammatories (NSAID): Secondary | ICD-10-CM

## 2018-04-21 DIAGNOSIS — M25522 Pain in left elbow: Secondary | ICD-10-CM

## 2018-04-21 DIAGNOSIS — G8929 Other chronic pain: Secondary | ICD-10-CM

## 2018-04-21 DIAGNOSIS — M659 Synovitis and tenosynovitis, unspecified: Secondary | ICD-10-CM

## 2018-04-21 DIAGNOSIS — M25462 Effusion, left knee: Secondary | ICD-10-CM

## 2018-04-21 DIAGNOSIS — Z79899 Other long term (current) drug therapy: Secondary | ICD-10-CM

## 2018-04-21 DIAGNOSIS — E559 Vitamin D deficiency, unspecified: Secondary | ICD-10-CM

## 2018-04-21 DIAGNOSIS — M25521 Pain in right elbow: Secondary | ICD-10-CM

## 2018-04-21 LAB — CBC W/ AUTO DIFF
BASOPHILS ABSOLUTE COUNT: 0 10*9/L (ref 0.0–0.1)
BASOPHILS RELATIVE PERCENT: 0.9 %
EOSINOPHILS ABSOLUTE COUNT: 0 10*9/L (ref 0.0–0.4)
EOSINOPHILS RELATIVE PERCENT: 0.9 %
HEMATOCRIT: 35.2 % — ABNORMAL LOW (ref 41.0–53.0)
HEMOGLOBIN: 11.1 g/dL — ABNORMAL LOW (ref 13.5–17.5)
LARGE UNSTAINED CELLS: 2 % (ref 0–4)
LYMPHOCYTES ABSOLUTE COUNT: 1.6 10*9/L (ref 1.5–5.0)
LYMPHOCYTES RELATIVE PERCENT: 35.7 %
MEAN CORPUSCULAR HEMOGLOBIN CONC: 31.6 g/dL (ref 31.0–37.0)
MEAN CORPUSCULAR HEMOGLOBIN: 28.7 pg (ref 26.0–34.0)
MEAN CORPUSCULAR VOLUME: 91 fL (ref 80.0–100.0)
MONOCYTES RELATIVE PERCENT: 4.3 %
NEUTROPHILS ABSOLUTE COUNT: 2.6 10*9/L (ref 2.0–7.5)
NEUTROPHILS RELATIVE PERCENT: 56.7 %
PLATELET COUNT: 388 10*9/L (ref 150–440)
RED BLOOD CELL COUNT: 3.86 10*12/L — ABNORMAL LOW (ref 4.50–5.90)
RED CELL DISTRIBUTION WIDTH: 12.8 % (ref 12.0–15.0)
WBC ADJUSTED: 4.6 10*9/L (ref 4.5–11.0)

## 2018-04-21 LAB — COMPREHENSIVE METABOLIC PANEL
ALBUMIN: 4.4 g/dL (ref 3.5–5.0)
ALKALINE PHOSPHATASE: 59 U/L (ref 38–126)
ALT (SGPT): 13 U/L — ABNORMAL LOW (ref 19–72)
ANION GAP: 9.2 mmol/L (ref 9–15)
AST (SGOT): 29 U/L (ref 19–55)
BILIRUBIN TOTAL: 0.5 mg/dL (ref 0.0–1.2)
BLOOD UREA NITROGEN: 13 mg/dL (ref 7–21)
BUN / CREAT RATIO: 20
CALCIUM: 10.1 mg/dL (ref 8.5–10.2)
CHLORIDE: 103 mmol/L (ref 98–107)
CO2: 29.8 mmol/L (ref 22.0–30.0)
CREATININE: 0.65 mg/dL — ABNORMAL LOW (ref 0.70–1.30)
EGFR MDRD AF AMER: 180 mL/min/{1.73_m2}
EGFR MDRD NON AF AMER: 148 mL/min/{1.73_m2}
POTASSIUM: 3.6 mmol/L (ref 3.5–5.0)
PROTEIN TOTAL: 7.9 g/dL (ref 6.5–8.3)
SODIUM: 142 mmol/L (ref 135–145)

## 2018-04-21 LAB — SMEAR REVIEW

## 2018-04-21 LAB — NEUTROPHILS ABSOLUTE COUNT: Lab: 2.6

## 2018-04-21 LAB — CO2: Carbon dioxide:SCnc:Pt:Ser/Plas:Qn:: 29.8

## 2018-04-21 MED ORDER — PREDNISONE 5 MG TABLETS IN A DOSE PACK
PACK | Freq: Every day | ORAL | 0 refills | 0.00000 days | Status: CP
Start: 2018-04-21 — End: 2018-05-23

## 2018-04-21 NOTE — Unmapped (Signed)
Preventing Falls: Care Instructions  Your Care Instructions    Getting around your home safely can be a challenge if you have injuries or health problems that make it easy for you to fall. Loose rugs and furniture in walkways are among the dangers for many older people who have problems walking or who have poor eyesight. People who have conditions such as arthritis, osteoporosis, or dementia also have to be careful not to fall.  You can make your home safer with a few simple measures.  Follow-up care is a key part of your treatment and safety. Be sure to make and go to all appointments, and call your doctor if you are having problems. It's also a good idea to know your test results and keep a list of the medicines you take.  How can you care for yourself at home?  Taking care of yourself  ?? You may get dizzy if you do not drink enough water. To prevent dehydration, drink plenty of fluids, enough so that your urine is light yellow or clear like water. Choose water and other caffeine-free clear liquids. If you have kidney, heart, or liver disease and have to limit fluids, talk with your doctor before you increase the amount of fluids you drink.  ?? Exercise regularly to improve your strength, muscle tone, and balance. Walk if you can. Swimming may be a good choice if you cannot walk easily.  ?? Have your vision and hearing checked each year or any time you notice a change. If you have trouble seeing and hearing, you might not be able to avoid objects and could lose your balance.  ?? Know the side effects of the medicines you take. Ask your doctor or pharmacist whether the medicines you take can affect your balance. Sleeping pills or sedatives can affect your balance.  ?? Limit the amount of alcohol you drink. Alcohol can impair your balance and other senses.  ?? Ask your doctor whether calluses or corns on your feet need to be removed. If you wear loose-fitting shoes because of calluses or corns, you can lose your balance and fall.  ?? Talk to your doctor if you have numbness in your feet.  Preventing falls at home  ?? Remove raised doorway thresholds, throw rugs, and clutter. Repair loose carpet or raised areas in the floor.  ?? Move furniture and electrical cords to keep them out of walking paths.  ?? Use nonskid floor wax, and wipe up spills right away, especially on ceramic tile floors.  ?? If you use a walker or cane, put rubber tips on it. If you use crutches, clean the bottoms of them regularly with an abrasive pad, such as steel wool.  ?? Keep your house well lit, especially stairways, porches, and outside walkways. Use night-lights in areas such as hallways and bathrooms. Add extra light switches or use remote switches (such as switches that go on or off when you clap your hands) to make it easier to turn lights on if you have to get up during the night.  ?? Install sturdy handrails on stairways.  ?? Move items in your cabinets so that the things you use a lot are on the lower shelves (about waist level).  ?? Keep a cordless phone and a flashlight with new batteries by your bed. If possible, put a phone in each of the main rooms of your house, or carry a cell phone in case you fall and cannot reach a phone. Or, you can wear  a device around your neck or wrist. You push a button that sends a signal for help.  ?? Wear low-heeled shoes that fit well and give your feet good support. Use footwear with nonskid soles. Check the heels and soles of your shoes for wear. Repair or replace worn heels or soles.  ?? Do not wear socks without shoes on wood floors.  ?? Walk on the grass when the sidewalks are slippery. If you live in an area that gets snow and ice in the winter, sprinkle salt on slippery steps and sidewalks.  Preventing falls in the bath  ?? Install grab bars and nonskid mats inside and outside your shower or tub and near the toilet and sinks.  ?? Use shower chairs and bath benches.  ?? Use a hand-held shower head that will allow you to sit while showering.  ?? Get into a tub or shower by putting the weaker leg in first. Get out of a tub or shower with your strong side first.  ?? Repair loose toilet seats and consider installing a raised toilet seat to make getting on and off the toilet easier.  ?? Keep your bathroom door unlocked while you are in the shower.  Where can you learn more?  Go to [facility] at [email or web address]  Select Preferences in the upper right hand corner, then select Health Library under Resources. Enter G117 in the search box to learn more about Preventing Falls: Care Instructions.  Current as of: September 04, 2017  Content Version: 12.1  ?? 2006-2019 Health[employer], Inc[employer]. Care instructions adapted under license by [facility]. If you have questions about a medical condition or this instruction, always ask your healthcare professional. [employer], Incorporated disclaims any warranty or liability for your use of this information.

## 2018-04-21 NOTE — Unmapped (Signed)
Subjective:       Carlos Hodges is a 27 y.o. male who was referred by Dr Renato Battles for evaluation of rheumatoid arthritis.  Symptoms have been present for several years. Onset was gradual. Symptoms include afternoon fatigue, joint pain, joint swelling and morning stiffness and are moderate in nature. Patient denies eye symptoms and skin nodules. Symptoms are made worse by: cold exposure, kneeling, overhead work and raising arm over head.  Symptoms are helped by arthritis medications and rest. Associated symptoms include arthralgia, fatigue, fevers, joint pain and morning stiffness. Patient denies associated alopecia, bleeding/clotting problems, depression, fevers, memory loss, muscle weakness, nausea, new headache, nodules, oral ulcers, palpitations, pleurisy, polydypsia, polyuria, rashes/photosensitive, Raynaud's and seizures.  Overall disease activity is characterized as ongoing.  Limitation on activities include none.     His symptoms began at age 9 when he experienced a fall at the playground and injured his left elbow.  Initially,  it was thought that he had hairline fracture.  A 1 month after the fall, the patient started experiencing swelling, pain and limitation  of range of motion of the left elbow.  A year later, he started having similar symptoms in the right elbow.   Four years later, he started experiencing intermittent symptoms involving the right knee, at the same time that his left elbows stopped hurting.  Two years later in an additive fashion again, the left knee started hurting and the right elbow stopped hurting.  The knee symptoms persisted intermittently and in 2010 had recurrence of the right elbow symptoms.  He has never had involvement of the  small joints of his fingers, wrists, shoulders and neck.  He deniesinvolvement of the toes, ankles and low back.  He was treated at [facility] with intra-articularorticosteroid injection and taper of prednisone that helped hissymptoms.  He reports 1 hour morning stiffness in the left knee. He has received nonsteroidal anti-inflammatories and several corticosteroid injections.  He was treated for 1-2 years with methotrexate that was ineffective.  He was also treated with a combination of methotrexate and Enbrel during that time for 6-9 months without improvement.  Humira plus methotrexate were also tried without improvement. A trial of tacrolimus was ineffective. The patient was last see by Dr Olean Ree in April, [id number] - then all serologies including rheumatoid factor, CCP antibodies, ANA, ENA, Lyme serologies and RMSF were negative.  Review of the MRI of the left knee revealed synovitis, effusion, but no evidence for hemosiderin deposition.  Then the patient was followed by Dr Derrek Gu (rheumatologist). He was started on Xeljanz in May, 2014 with good control of his RA and no side effects, stable labs. The patient had been off of Xeljanz in a few months (didn't get refills)  The patient was diagnosed with MS in 2014 and followed by [facility]. He is on Tysabri therapy. The patient has history of rigors with the last two infusions that treated with Benadryl and steroids. He did not have any other symptoms and his rigors resolved without any residual complaints. He feels well today.  He is up to date with his physical exams, He had negative TB test.    Recent lab results showed low Vitamin D level. His BDS test was normal in November, 2017.  The patient reports no recent infections or injuries or RA flares. He is pain free today.    The patient had arthrocentesis and left knee steroid injection by Dr Margaretmary Bayley with excellent results. Synovial fluid results were  negative for crystals.    The patient was last seen in March, 2018. He had MS flare-up on 07/19/2017 and treated with IV methylprednisolone.  He was on on Aubagio, neurology recommend to change treatment to Rituximab.    Elbow Pain: Patient complains of right elbow pain. Onset of the symptoms was a week ago. Inciting event: none known and lifting boxes?. Current symptoms include locking, pain radiating to the wrist and swelling. Pain is aggravated by: grasping, lifting heavy objects, supination/pronation as when opening doors. Patient's overall course: gradually worsening. Patient has had prior elbow problems. Evaluation to date: an Orthopedics consult, recommended steroid injection. Treatment to date: avoidance of offending activity, ice and OTC analgesics.  Ortho recommended US guided steroid injection in the past.    He finished 2 steroid tapers and his elbows are doing a lot better. Today he reports right knee swelling.    Disease History:  Seropositive:  no  Erosive:  yes    Previous therapies: Patient has had a course of DMARDs in the past.  Disease activity: low.  Functional status: assessed this visit.  Disease prognosis: good.    Previous Report(s) Reviewed: historical medical records, lab reports, office notes, radiology reports, referral letter/letters and x-ray reports     No Known Allergies  Current Outpatient Medications   Medication Sig Dispense Refill   ??? cholecalciferol, vitamin D3, (VITAMIN D3) 4,000 unit cap Take 4,000 Units by mouth daily. 30 capsule 11   ??? cholestyramine-aspartame 4 gram Powd Take 8 g by mouth Three (3) times a day. Over 11 days. 264 g 0   ??? predniSONE (DELTASONE) 10 MG tablet Take 1 tablet (10 mg total) by mouth daily. 30 tablet 0   ??? predniSONE (DELTASONE) 5 mg DsPk Take by mouth daily. Taper from 30 mg down to zero over 6 days. 1 Package 0     No current facility-administered medications for this visit.      No family history on file.  Current Outpatient Medications on File Prior to Visit   Medication Sig Dispense Refill   ??? cholecalciferol, vitamin D3, (VITAMIN D3) 4,000 unit cap Take 4,000 Units by mouth daily. 30 capsule 11   ??? cholestyramine-aspartame 4 gram Powd Take 8 g by mouth Three (3) times a day. Over 11 days. 264 g 0   ??? predniSONE (DELTASONE) 10 MG tablet Take 1 tablet (10 mg total) by mouth daily. 30 tablet 0     No current facility-administered medications on file prior to visit.      Past Medical History:   Diagnosis Date   ??? Arthritis    ??? Joint pain    ??? MS (multiple sclerosis) (CMS-HCC)    ??? Rheumatoid arthritis(714.0)      Past Surgical History:   Procedure Laterality Date   ??? APPENDECTOMY     ??? ELBOW SURGERY     ??? HERNIA REPAIR       Patient Active Problem List    Diagnosis Date Noted   ??? Synovitis, villonodular, elbow, right 04/21/2018   ??? Synovitis of elbow 04/21/2018   ??? Immunosuppression (CMS-HCC) 12/01/2017   ??? Drug toxicity 12/01/2017   ??? Swelling of right elbow 11/22/2017   ??? Long term current use of non-steroidal anti-inflammatories (NSAID) 09/18/2016   ??? Risk for falls 06/07/2016   ??? High risk medication use 06/07/2016   ??? Rigors 06/07/2016   ??? Pain of both elbows 06/07/2016   ??? Bilateral knee swelling 06/07/2016   ??? Vitamin D  deficiency disease 06/07/2016   ??? Chronic pain of both knees 06/07/2016   ??? Disorder of bone  06/07/2016   ??? Multiple sclerosis (CMS-HCC) 10/27/2014   ??? Rheumatoid arthritis (CMS-HCC) 10/09/2013   ??? Temporal arteritis (CMS-HCC) 10/09/2013     Social History     Socioeconomic History   ??? Marital status: Single     Spouse name: Not on file   ??? Number of children: Not on file   ??? Years of education: Not on file   ??? Highest education level: Not on file   Occupational History   ??? Not on file   Social Needs   ??? Financial resource strain: Not on file   ??? Food insecurity:     Worry: Not on file     Inability: Not on file   ??? Transportation needs:     Medical: Not on file     Non-medical: Not on file   Tobacco Use   ??? Smoking status: Never Smoker   ??? Smokeless tobacco: Never Used   Substance and Sexual Activity   ??? Alcohol use: No     Alcohol/week: 0.0 oz     Comment: social   ??? Drug use: No   ??? Sexual activity: Not on file   Lifestyle   ??? Physical activity:     Days per week: Not on file     Minutes per session: Not on file   ??? Stress: Not on file   Relationships   ??? Social connections:     Talks on phone: Not on file     Gets together: Not on file     Attends religious service: Not on file     Active member of club or organization: Not on file     Attends meetings of clubs or organizations: Not on file     Relationship status: Not on file   Other Topics Concern   ??? Do you use sunscreen? No   ??? Tanning bed use? No   ??? Are you easily burned? No   ??? Excessive sun exposure? No   ??? Blistering sunburns? No   Social History Narrative    Lives alone in [place].    Disabled due to RA and MS    School , 4 classes, M-F, full time student.    Enjoys reading     BP 118/63  - Pulse 85  - Ht 182.9 cm (6' 0.01)  - Wt 63.1 kg (139 lb 1.6 oz)  - BMI 18.86 kg/m??     Review of Systems  A 12 point review of systems was negative except for pertinent items noted in the HPI.      Objective:    BP 118/63  - Pulse 85  - Ht 182.9 cm (6' 0.01)  - Wt 63.1 kg (139 lb 1.6 oz)  - BMI 18.86 kg/m??     General Appearance:  Alert, cooperative, no distress, appears stated age   Head:  Normocephalic, without obvious abnormality, atraumatic   Eyes:  PERRL, conjunctiva/corneas clear, EOM's intact, fundi benign, both eyes   Ears:  Normal TM's and external ear canals, both ears   Nose: Nares normal, septum midline, mucosa normal, no drainage or sinus tenderness   Throat: Lips, mucosa, and tongue normal; teeth and gums normal   Neck: Supple, symmetrical, trachea midline, no adenopathy, thyroid: not enlarged, symmetric, no tenderness/mass/nodules, no carotid bruit or JVD   Back:   Symmetric, no curvature, ROM normal, no CVA tenderness  Lungs:   Clear to auscultation bilaterally, respirations unlabored   Chest Wall:  No tenderness or deformity   Heart:  Regular rate and rhythm, S1, S2 normal, no murmur, rub or gallop   Abdomen:   Soft, non-tender, bowel sounds active all four quadrants,  no masses, no organomegaly   Genitalia:  Normal male Musculosceletal:  Small joint of the hands and feet without synovitis. Full ROM of the spine, hands, feet, hips. Right elbow large effusion with limited flexion and extension.   Extremities: Extremities normal, atraumatic, no cyanosis or edema   Pulses: 2+ and symmetric   Skin: Skin color, texture, turgor normal, no rashes or lesions   Lymph nodes: Cervical, supraclavicular, and axillary nodes normal   Neurologic: Normal         Imaging    MRI of the T spine: Stable thoracic cord T2 signal abnormalities compared to study dated 01/23/15 consistent with known history of multiple sclerosis. No new or enhancing lesions.    Grossly unchanged white matter burden compared to dated 01/23/15. More conspicuous lesion within the dorsal lateral aspect of the cervical spinal cord at C3. Finding might be related to differences in technique. No new or enhancing lesions.      Lab Review    LFTS normal  CBC stable     Assessment:     Patient Active Problem List    Diagnosis Date Noted   ??? Synovitis, villonodular, elbow, right 04/21/2018   ??? Synovitis of elbow 04/21/2018   ??? Immunosuppression (CMS-HCC) 12/01/2017   ??? Drug toxicity 12/01/2017   ??? Swelling of right elbow 11/22/2017   ??? Long term current use of non-steroidal anti-inflammatories (NSAID) 09/18/2016   ??? Risk for falls 06/07/2016   ??? High risk medication use 06/07/2016   ??? Rigors 06/07/2016   ??? Pain of both elbows 06/07/2016   ??? Bilateral knee swelling 06/07/2016   ??? Vitamin D deficiency disease 06/07/2016   ??? Chronic pain of both knees 06/07/2016   ??? Disorder of bone  06/07/2016   ??? Multiple sclerosis (CMS-HCC) 10/27/2014   ??? Rheumatoid arthritis (CMS-HCC) 10/09/2013   ??? Temporal arteritis (CMS-HCC) 10/09/2013     Plan:     Orders Placed This Encounter   Procedures   ??? XR Elbow 3 Or More Views Right     Standing Status:   Future     Standing Expiration Date:   04/21/2019     Order Specific Question:   Reason for Exam:     Answer:   pain     Order Specific Question: Performed at     Answer:   [facility]   ??? CBC w/ Differential     Standing Status:   Future     Standing Expiration Date:   04/21/2019   ??? Comprehensive Metabolic Panel     Standing Status:   Future     Standing Expiration Date:   04/21/2019     Order Specific Question:   Is this a fasting order?     Answer:   No   ??? Ambulatory referral to Orthopedic Surgery     Standing Status:   Future     Standing Expiration Date:   04/22/2019     Referral Priority:   2-3 day URGENT - phone call required     Referral Type:   Generic Referral     Referral Reason:   Specialty Services Required     Referred to Provider:   Benjaman Kindler, MD  Number of Visits Requested:   1     Medications:continue present medication, discussed risks (side effects) and benefits of medication & encouraged to read about medication. We may need to restart Harriette Ohara therapy - we discussed potential side effects.  We discussed NSAID and steroid side effects.  Labs:  See orders.  Procedures: Bursa aspiration and injection not indicated at this time. and Joint aspiration and injection not indicated today.  Imaging: none.  Avoid heavy lifting.  Continue with Vitamin D supplementation.  Follow up with Neurology. Agree with Rituximab therapy for MS and RA. I provided information about Rituximab.  Referrals: Orthopedic clinic for the right elbow synovitis.  PT, massage, water therapy.  Discussion and guidance: Discussed importance of taking medication as directed, Discussed pros and cons and risks of joint injections, Handout given on rheumatoid arthritis, Handout given on diet and exercise, injury and fall prevention, Recommended rest for painful and swollen joints, Recommended regular exercise program once pain and swelling is decreased.  Follow-up In 3 months.  Call if worsening or not improving.  I have spent 25 minutes in consultation with the patient, of that time, I have spent more than 50% discussing his diagnosis, treatment options and coordination of her care.  The patient does not have a documented glucocorticoid management plan within the past 12 months.

## 2018-05-13 ENCOUNTER — Ambulatory Visit: Admit: 2018-05-13 | Discharge: 2018-05-14 | Payer: MEDICARE | Attending: Sports Medicine | Primary: Sports Medicine

## 2018-05-13 DIAGNOSIS — G35 Multiple sclerosis: Secondary | ICD-10-CM

## 2018-05-13 DIAGNOSIS — M0579 Rheumatoid arthritis with rheumatoid factor of multiple sites without organ or systems involvement: Principal | ICD-10-CM

## 2018-05-13 DIAGNOSIS — M175 Other unilateral secondary osteoarthritis of knee: Secondary | ICD-10-CM

## 2018-05-13 DIAGNOSIS — M19029 Primary osteoarthritis, unspecified elbow: Secondary | ICD-10-CM

## 2018-05-13 NOTE — Unmapped (Signed)
You had an ultrasound guided injection today.     We anticipate that this injection will help alleviate the symptoms.  However, cortisone injections can cause pain after the injection. This may last from a few day to a week or more.  Although uncommon, it does occur.  You may take over the counter medications and ice the affected area as needed or use and medications prescribed to you by Dr. Margaretmary Bayley.    Your injection also involved an anesthetic agent. This can cause numbness and tingling after injection. This is normal and will wear off.     If you have any redness, heat, warmth or any other questions or concerns related to today's visit please call the office.    [facility]: Engineer, manufacturing systems  Ask for Herbert Seta: [phone]     Family Medicine: [facility] Location  Ask for Christiane Ha: [phone]    We will see you back as scheduled.     Dr. Margaretmary Bayley and the sports medicine team continue to be grateful for your trust in your care!Marland Kitchen
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
## 2018-05-19 NOTE — Unmapped (Signed)
Phone call:  Patient is still interested in starting Ocrevus.  Patient has not completed all screening labs yet.  Unable to obtain locally due to no PCP there. Comes to [facility] for all of his care but is unable to com eup here until 06/20/2018 for Ortho appointment because he is working.  Lab collect orders placed for : HIV, Quaintiferon TB gold and HEP B and c serologies.

## 2018-05-21 NOTE — Unmapped (Signed)
Patient reports symptoms started yesterday at around 4 pm. He started experiencing numbness in right side of his face and mouth.He can eat and drink eat but notices temperature differences when food is in his mouth. Patient not currently on Prednisone for the past month. Patient reports a migraine since seven this morning. Pain in the back right of the head. It does not radiate. Pain currently at a four was at 6 at its' worse. Patient has been scheduled to see Yolande Jolly PA on 05/23/2018

## 2018-05-21 NOTE — Unmapped (Signed)
Received call from patient reporting symptoms that started yesterday around 4 to 5pm.      He says that he noticed that he started to have numbness in his face that has gone into his lips and maybe inside of his mouth.     He says that when he eats hot food he does not notice the heat when he has the food in his mouth but can feel the heat from the food in his hands.      He also reports that he started being off balance yesterday as well.      Also having a real bad migraine and he does not get migraines.      These symptoms have been going on continuously.      He can be reached at [phone].    Thanks, [employer]

## 2018-05-23 ENCOUNTER — Ambulatory Visit
Admit: 2018-05-23 | Discharge: 2018-05-24 | Payer: MEDICARE | Attending: Physician Assistant | Primary: Physician Assistant

## 2018-05-23 DIAGNOSIS — G35 Multiple sclerosis: Principal | ICD-10-CM

## 2018-05-23 NOTE — Unmapped (Addendum)
??  ??     In case of:  ?? a suspected relapse (new symptoms or worsening existing symptoms, lasting for >24h)  OR  ?? a need for an additional appointment for other reasons     Please contact:    [facility] Neurology [facility] Desk  Phone: [phone]      OR     Ms. Webb Laws  Administrative [facility], Department of Neurology  [address], [address]     [place], [place] [address]     Phone: [phone], Fax: [phone]           Yolande Jolly [facility]    [facility] Neurology /   Multiple Sclerosis Division    [address]    [place], [place] [address]

## 2018-05-23 NOTE — Unmapped (Signed)
[facility] [employer] [facility] at [facility]  Multiple Sclerosis / Neuroimmunology Division  Shadd Dunstan Julieanne Cotton [facility]  Physician Assistant    Phone: [phone]  Fax: [phone]  ????  Patient Name: Carlos Hodges   Date of Birth: 1991/10/16  Medical Record Number: [id number]  [address]  [place] [address]  ??  Direct entry by:  Cy Blamer, PA-C.  Teaching Physician: Dr. Desma Mcgregor.    DATE OF VISIT: May 23, 2018    REASON FOR VISIT: Followup in the Neuroimmunology Clinic for evaluation of relapsing remitting multiple sclerosis. Last seen by Dr. Johnnye Lana on 11/27/2017.    ASSESSMENT AND PLAN:  **Relapsing Remitting Multiple Sclerosis:  -Has not started Rituximab, to treat both RA and MS because the screening labs were not completed (was previously ordered to do locally). Once results are back RN will set up the infusions.   -Check Hep B s AG, s AB, Core AB, Hep C AB, , HIV AG/ AB.  -Check NMO APQ4 IgG and MOG IgG1 (History of Optic Neuritis and cervical spine lesions).   -CBC/d and CMP competed 04/21/2018.  -Performed T25FW.    -Right sided trigeminal Neurologia, mild and right leg weakness: Patient would prefer to monitor. If symptoms are worse or do not improve by next week then he will consider IV Steroids. Does not want to take oral medication such as Gabapentin at this time.    **Rheumatoid arthritis:  Continue follow ups with Dr. Hector Shade.    -Return to clinic in 3 months after starting Rituximab. Phone follow up in 5 days.  -Total visit time =    47  Minutes. 0917/1004.  Greater than 50% of the face to face time was spent in consultation and treatment planning on the  disease process, medication, dosing and side effects. MRI images reviewed personally by myself and with Dr.Dujmovic.    INTERVAL HISTORY CHIEF COMPLAINT:  Five days ago he started experiencing numbness on the right side of face  and inside his mouth. Unable to tell if hot from cold food.THis has been continuous since then.  He also  feels that his  Balance is off which  started at the same time.  Three days ago a headache started occipital right side of head that lasted for 14 hours and resolved when he went to sleep. No associated symptoms and did not take any medications.    Last had oral prednisone 5mg  three weeks ago for 6 days for right elbow swelling/ RA.    MS Flare questions (positives are in bold).  Fevers, Chills  Visual problems  URI, Cough, runny nose, sore throat or difficulty in breathing.  Infections  Excessive fatigue  Bladder symptoms, Dysuria,  or UTI's. Slight bladder leakage but does not need protective clothing.It iomes and goes.  Leg or arm weakness, right .  Pain  Numbness or tingling.   Reduction in mobility, falls or dropping things  Memory or concentration  Overheated, exercise or outside in heat  Stress    PRIOR HISTORY:   A 27 y.o.African American male.   He was seen as a new patient 09/2013 and diagnosed with relapsing Remitting Multiple Sclerosis. 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    He  reported loss of vision in September 2014 on the left eye which resolved after a course of intravenous Solu-Medrol over 4 days. 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    He had  spinal fluid analysis that was unremarkable.     MRI REVIEW:  10/11/2017  MRI of the brain with and without contrast compared to 02/03/2017: There are multiple white matter foci of abnormal increased T2/FLAIR signal. ??No enhancing lesions are seen. There is diffuse increased T2 signal throughout the white matter as well as prominent [place] spaces/atrophy greater than expected for age. In addition, one of the right vertex subcortical lesions in the region of the frontal lobe has a Ballo's sclerosis appearance. Atrophic bilateral optic nerves, similar to prior.    10/11/2017 MRI of the cervical spine with and without contrast compared to 02/03/2017: Demonstrated are multiple T2 hyperintense foci throughout the cervical spinal cord. There is new and enlarging areas of T2 hyperintense signal within the dorsal aspect of the cord, most prominent at C3-C5, for example. There are no enhancing lesions.      12/31/2015 MRI of the brain compared to 01/23/2015: Redemonstration of multiple periventricular and deep subcortical white matter T2/FLAIR hyperintensities, some of which are oriented perpendicular to the callosal septal interface compatible with known history of multiple sclerosis. There are at least 3 new deep subcortical white matter lesions compared to study dated 01/23/15. No enhancing lesions to suggest active demyelination.    10/11/2017 MRI of the thoracic spine with and without contrast compared to 02/03/2017: Redemonstrated are multiple T2 hyperintense lesions throughout the thoracic spinal cord. Not significant changed when compared to the prior exam. There are no definitive new cord lesions. There is no abnormal enhancement.    MS FLARE UP HISTORY:  2017 flare-up when he received IV methylprednisolone  Symptoms were  Bilateral legs numbness and weakness.  07/19/2017 right leg and left hand weakness confirmed on exam. RA flare-up right knee. Received IV steroids for 5 days. All symptoms resolved.    MS MEDICATION:  Gilenya started 02/01/2014. MRI of the brain 12/31/2015 compared to 03/37/2016 shows at least 3 new non enhancing lesions in the deep subcortical white matter.  Tysabri  02/2016- 12/2016. Had rigors with each infusion therfore discontinued.  Aubagio  02/2017- 10/2017.    FAMILY HISTORY:  No MS, RA or diabetes.    REVIEW OF SYSTEMS:  A 10-systems review was performed and, unless otherwise noted, declared negative by patient.    No visits with results within 1 Month(s) from this visit.   Latest known visit with results is:   Appointment on 04/21/2018   Component Date Value Ref Range Status   ??? Sodium 04/21/2018 142  135 - 145 mmol/L Final   ??? Potassium 04/21/2018 3.6  3.5 - 5.0 mmol/L Final   ??? Chloride 04/21/2018 103  98 - 107 mmol/L Final   ??? CO2 04/21/2018 29.8  22.0 - 30.0 mmol/L Final   ??? BUN 04/21/2018 13  7 - 21 mg/dL Final   ??? Creatinine 04/21/2018 0.65* 0.70 - 1.30 mg/dL Final   ??? BUN/Creatinine Ratio 04/21/2018 20   Final   ??? EGFR MDRD Non Af Amer 04/21/2018 148  mL/min/1.53m2 Final   ??? EGFR MDRD Af Amer 04/21/2018 180  mL/min/1.80m2 Final   ??? Anion Gap 04/21/2018 9.2  9 - 15 mmol/L Final   ??? Glucose 04/21/2018 108  65 - 179 mg/dL Final   ??? Calcium 13/05/6577 10.1  8.5 - 10.2 mg/dL Final   ??? Albumin 46/96/2952 4.4  3.5 - 5.0 g/dL Final   ??? Total Protein 04/21/2018 7.9  6.5 - 8.3 g/dL Final   ??? Total Bilirubin 04/21/2018 0.5  0.0 - 1.2  mg/dL Final   ??? AST 82/95/6213 29  19 - 55 U/L Final   ??? ALT 04/21/2018 13* 19 - 72 U/L Final   ??? Alkaline Phosphatase 04/21/2018 59  38 - 126 U/L Final   ??? WBC 04/21/2018 4.6  4.5 - 11.0 10*9/L Final   ??? RBC 04/21/2018 3.86* 4.50 - 5.90 10*12/L Final   ??? HGB 04/21/2018 11.1* 13.5 - 17.5 g/dL Final   ??? HCT 08/65/7846 35.2* 41.0 - 53.0 % Final   ??? MCV 04/21/2018 91.0  80.0 - 100.0 fL Final   ??? MCH 04/21/2018 28.7  26.0 - 34.0 pg Final   ??? MCHC 04/21/2018 31.6  31.0 - 37.0 g/dL Final   ??? RDW 96/29/5284 12.8  12.0 - 15.0 % Final   ??? MPV 04/21/2018 7.7  7.0 - 10.0 fL Final   ??? Platelet 04/21/2018 388  150 - 440 10*9/L Final   ??? Neutrophils % 04/21/2018 56.7  % Final   ??? Lymphocytes % 04/21/2018 35.7  % Final   ??? Monocytes % 04/21/2018 4.3  % Final   ??? Eosinophils % 04/21/2018 0.9  % Final   ??? Basophils % 04/21/2018 0.9  % Final   ??? Absolute Neutrophils 04/21/2018 2.6  2.0 - 7.5 10*9/L Final   ??? Absolute Lymphocytes 04/21/2018 1.6  1.5 - 5.0 10*9/L Final   ??? Absolute Monocytes 04/21/2018 0.2  0.2 - 0.8 10*9/L Final   ??? Absolute Eosinophils 04/21/2018 0.0  0.0 - 0.4 10*9/L Final   ??? Absolute Basophils 04/21/2018 0.0  0.0 - 0.1 10*9/L Final   ??? Large Unstained Cells 04/21/2018 2  0 - 4 % Final   ??? Hypochromasia 04/21/2018 Marked* Not Present Final   ??? Smear Review Comments 04/21/2018 See Comment* Undefined Final     PROBLEM LIST:    Patient Active Problem List   Diagnosis   ??? Rheumatoid arthritis (CMS-HCC)   ??? Temporal arteritis (CMS-HCC)   ??? Multiple sclerosis (CMS-HCC)   ??? Risk for falls   ??? High risk medication use   ??? Rigors   ??? Pain of both elbows   ??? Bilateral knee swelling   ??? Vitamin D deficiency disease   ??? Chronic pain of both knees   ??? Disorder of bone    ??? Long term current use of non-steroidal anti-inflammatories (NSAID)   ??? Swelling of right elbow   ??? Immunosuppression (CMS-HCC)   ??? Drug toxicity   ??? Synovitis, villonodular, elbow, right   ??? Synovitis of elbow     No current outpatient medications on file.     No current facility-administered medications for this visit.        Past Surgical Hx:    Past Surgical History:   Procedure Laterality Date   ??? APPENDECTOMY     ??? ELBOW SURGERY     ??? HERNIA REPAIR         Social Hx:    Social History     Socioeconomic History   ??? Marital status: Single     Spouse name: Not on file   ??? Number of children: Not on file   ??? Years of education: Not on file   ??? Highest education level: Not on file   Occupational History   ??? Not on file   Social Needs   ??? Financial resource strain: Not on file   ??? Food insecurity:     Worry: Not on file     Inability: Not on file   ???  Transportation needs:     Medical: Not on file     Non-medical: Not on file   Tobacco Use   ??? Smoking status: Never Smoker   ??? Smokeless tobacco: Never Used   Substance and Sexual Activity   ??? Alcohol use: No     Alcohol/week: 0.0 standard drinks     Comment: social   ??? Drug use: No   ??? Sexual activity: Not on file   Lifestyle   ??? Physical activity:     Days per week: Not on file     Minutes per session: Not on file   ??? Stress: Not on file   Relationships   ??? Social connections:     Talks on phone: Not on file     Gets together: Not on file     Attends religious service: Not on file     Active member of club or organization: Not on file     Attends meetings of clubs or organizations: Not on file     Relationship status: Not on file   Other Topics Concern   ??? Do you use sunscreen? No   ??? Tanning bed use? No   ??? Are you easily burned? No   ??? Excessive sun exposure? No   ??? Blistering sunburns? No   Social History Narrative    Lives alone in [place].    Disabled due to RA and MS    School , 4 classes, M-F, full time student.    Enjoys reading       ALLERGIES:  No Known Allergies    VITAL SIGNS  BP 106/58 (BP Site: L Arm, BP Position: Sitting, BP Cuff Size: Medium)  - Pulse 65  - Ht 182.9 cm (6' 0.01)  - Wt 62.6 kg (138 lb)  - BMI 18.71 kg/m??     PHYSICAL EXAMINATION:  GENERAL:  Alert and oriented to person, place, time and situation.      Neurological Examination:     Cranial Nerves:   II, III- Pupils are equal 3 mm and reactive to light b/l.  Visual Acuity:  with glasses, OD 20/20, OS 20/25.  Reports no  Scotoma.  Confrontation intact.  Fundoscopic exam: Clear optic disc margins, no optic disc pallor    III, IV, VI- extra ocular movements are intact, No ptosis, no nystagmus.  V- sensation of the face decreased on the  right in all three branch of trigeminal nerve to pin prick  but can tell sharp from dull.  VII- face symmetrical, no facial droop, normal facial movements with smile/grimace  VIII- Hearing grossly intact.  IX and X- symmetric palate contraction, normal gag bilaterally  XI- Full shoulder shrug bilaterally  XII- Tongue protrudes midline, full range of movements of the tongue    Motor Exam:   ??  Muscles UEs ?? LEs   ?? R L ?? R L   Deltoids 5/5 5/5 Hip flexors  4/5 5/5   Biceps 5/5 5/5 Hip extensors 4/5 5/5   Triceps 5/5 5/5 Knee flexors 4/5 5/5   Hand grip 5/5 5/5 Knee extensors 4/5 5/5   Wrist flexors 5/5 5/5 Foot dorsal flexors 5/5 5/5   Wrist extensors 5/5 5/5 Foot plantar flexors 5/5 5/5   Finger flexors 5/5 5/5      Finger extensors 5/5 5/5         Normal bulk and tone.  No clonus.    Reflexes R L Brachioradialis +2 +2   Biceps +2 +  2   Triceps +2 +2   Patella +2 +2   Achilles +1 +1     Positive babinski.    Sensory UEs LEs    R L R L          Pin prick WNL WNL WNL WNL   Vibration WNL WNL WNL WNL   Proprioception WNL WNL WNL WNL        Cerebellar/Coordination:  Rapid alternating movements, finger-to-nose and  bilaterally demonstrates no abnormalities.   Heel-to-shin on the right abnormal.    Romberg was intact with eyes closed.  Gait: normal stride and arm swing.  Able to  Heel and toe gait.   Unable to tandem walk.    T25FW= 6.26.

## 2018-05-26 LAB — HIV ANTIGEN/ANTIBODY COMBO: HIV 1+2 Ab+HIV1 p24 Ag:PrThr:Pt:Ser/Plas:Ord:IA: NONREACTIVE

## 2018-05-26 LAB — QUANTIFERON TB GOLD PLUS
Lab: NEGATIVE
QUANTIFERON TB NIL VALUE: 0.07 [IU]/mL

## 2018-05-26 LAB — HEPATITIS B SURFACE ANTIBODY QUANT: Hepatitis B virus surface Ab:ACnc:Pt:Ser:Qn:: <8

## 2018-05-26 LAB — HEPATITIS C ANTIBODY: Hepatitis C virus Ab:PrThr:Pt:Ser:Ord:: NONREACTIVE

## 2018-05-26 LAB — HEPATITIS B CORE TOTAL ANTIBODY: Hepatitis B virus core Ab:PrThr:Pt:Ser/Plas:Ord:IA: NONREACTIVE

## 2018-05-26 LAB — HEPATITIS B SURFACE ANTIGEN: Hepatitis B virus surface Ag:PrThr:Pt:Ser:Ord:: NONREACTIVE

## 2018-06-20 ENCOUNTER — Ambulatory Visit: Admit: 2018-06-20 | Discharge: 2018-06-21 | Payer: MEDICARE | Attending: Sports Medicine | Primary: Sports Medicine

## 2018-06-20 DIAGNOSIS — M170 Bilateral primary osteoarthritis of knee: Principal | ICD-10-CM

## 2018-06-26 ENCOUNTER — Ambulatory Visit: Admit: 2018-06-26 | Discharge: 2018-06-27 | Payer: MEDICARE

## 2018-06-26 ENCOUNTER — Ambulatory Visit: Admit: 2018-06-26 | Discharge: 2018-06-27 | Payer: MEDICARE | Attending: Family Medicine | Primary: Family Medicine

## 2018-06-26 DIAGNOSIS — T8090XA Unspecified complication following infusion and therapeutic injection, initial encounter: Principal | ICD-10-CM

## 2018-06-26 DIAGNOSIS — G35 Multiple sclerosis: Principal | ICD-10-CM

## 2018-06-26 DIAGNOSIS — Z79899 Other long term (current) drug therapy: Secondary | ICD-10-CM

## 2018-06-26 DIAGNOSIS — M069 Rheumatoid arthritis, unspecified: Secondary | ICD-10-CM

## 2018-06-27 ENCOUNTER — Ambulatory Visit: Admit: 2018-06-27 | Discharge: 2018-06-28 | Payer: MEDICARE | Attending: Sports Medicine | Primary: Sports Medicine

## 2018-06-27 DIAGNOSIS — M170 Bilateral primary osteoarthritis of knee: Principal | ICD-10-CM

## 2018-07-04 ENCOUNTER — Ambulatory Visit: Admit: 2018-07-04 | Discharge: 2018-07-05 | Payer: MEDICARE | Attending: Sports Medicine | Primary: Sports Medicine

## 2018-07-04 DIAGNOSIS — M170 Bilateral primary osteoarthritis of knee: Principal | ICD-10-CM

## 2018-08-05 MED ORDER — PREDNISONE 5 MG TABLETS IN A DOSE PACK
PACK | Freq: Every day | ORAL | 0 refills | 0 days | Status: CP
Start: 2018-08-05 — End: 2018-08-18

## 2018-08-18 MED ORDER — PREDNISONE 5 MG TABLETS IN A DOSE PACK
PACK | Freq: Every day | ORAL | 0 refills | 0.00000 days | Status: CP
Start: 2018-08-18 — End: 2018-09-29

## 2018-09-29 MED ORDER — PREDNISONE 5 MG TABLETS IN A DOSE PACK
PACK | Freq: Every day | ORAL | 0 refills | 0.00000 days | Status: CP
Start: 2018-09-29 — End: ?

## 2021-12-18 ENCOUNTER — Ambulatory Visit: Payer: Self-pay | Admitting: Neurology

## 2021-12-18 ENCOUNTER — Encounter: Payer: Self-pay | Admitting: Neurology

## 2022-03-19 ENCOUNTER — Other Ambulatory Visit: Payer: Self-pay

## 2022-03-19 ENCOUNTER — Emergency Department (HOSPITAL_COMMUNITY): Payer: Medicare Other

## 2022-03-19 ENCOUNTER — Encounter (HOSPITAL_BASED_OUTPATIENT_CLINIC_OR_DEPARTMENT_OTHER): Payer: Self-pay

## 2022-03-19 ENCOUNTER — Observation Stay (HOSPITAL_BASED_OUTPATIENT_CLINIC_OR_DEPARTMENT_OTHER)
Admission: EM | Admit: 2022-03-19 | Discharge: 2022-03-20 | Disposition: A | Discharge status: Home / Self Care | Payer: Medicare Other | Attending: Family Medicine | Admitting: Family Medicine

## 2022-03-19 DIAGNOSIS — M4802 Spinal stenosis, cervical region: Secondary | ICD-10-CM | POA: Diagnosis not present

## 2022-03-19 DIAGNOSIS — R202 Paresthesia of skin: Secondary | ICD-10-CM | POA: Insufficient documentation

## 2022-03-19 DIAGNOSIS — R29810 Facial weakness: Secondary | ICD-10-CM | POA: Insufficient documentation

## 2022-03-19 DIAGNOSIS — R634 Abnormal weight loss: Secondary | ICD-10-CM | POA: Insufficient documentation

## 2022-03-19 DIAGNOSIS — Z681 Body mass index (BMI) 19 or less, adult: Secondary | ICD-10-CM | POA: Insufficient documentation

## 2022-03-19 DIAGNOSIS — D72819 Decreased white blood cell count, unspecified: Secondary | ICD-10-CM | POA: Insufficient documentation

## 2022-03-19 DIAGNOSIS — R200 Anesthesia of skin: Secondary | ICD-10-CM | POA: Diagnosis not present

## 2022-03-19 DIAGNOSIS — M50222 Other cervical disc displacement at C5-C6 level: Secondary | ICD-10-CM | POA: Diagnosis not present

## 2022-03-19 DIAGNOSIS — G35 Multiple sclerosis: Secondary | ICD-10-CM | POA: Diagnosis not present

## 2022-03-19 DIAGNOSIS — Z20822 Contact with and (suspected) exposure to covid-19: Secondary | ICD-10-CM | POA: Diagnosis not present

## 2022-03-19 DIAGNOSIS — R1032 Left lower quadrant pain: Secondary | ICD-10-CM | POA: Diagnosis not present

## 2022-03-19 DIAGNOSIS — M069 Rheumatoid arthritis, unspecified: Secondary | ICD-10-CM | POA: Insufficient documentation

## 2022-03-19 DIAGNOSIS — R001 Bradycardia, unspecified: Secondary | ICD-10-CM | POA: Diagnosis not present

## 2022-03-19 DIAGNOSIS — Z79899 Other long term (current) drug therapy: Secondary | ICD-10-CM | POA: Insufficient documentation

## 2022-03-19 DIAGNOSIS — R270 Ataxia, unspecified: Secondary | ICD-10-CM | POA: Insufficient documentation

## 2022-03-19 LAB — CBC WITH DIFFERENTIAL/PLATELET
Abs Immature Granulocytes: 0 10*3/uL (ref 0.00–0.07)
Basophils Absolute: 0 10*3/uL (ref 0.0–0.1)
Basophils Relative: 1 %
Eosinophils Absolute: 0 10*3/uL (ref 0.0–0.5)
Eosinophils Relative: 2 %
HCT: 39.5 % (ref 39.0–52.0)
Hemoglobin: 12.2 g/dL — ABNORMAL LOW (ref 13.0–17.0)
Immature Granulocytes: 0 %
Lymphocytes Relative: 53 %
Lymphs Abs: 1.4 10*3/uL (ref 0.7–4.0)
MCH: 27.6 pg (ref 26.0–34.0)
MCHC: 30.9 g/dL (ref 30.0–36.0)
MCV: 89.4 fL (ref 80.0–100.0)
Monocytes Absolute: 0.1 10*3/uL (ref 0.1–1.0)
Monocytes Relative: 5 %
Neutro Abs: 1 10*3/uL — ABNORMAL LOW (ref 1.7–7.7)
Neutrophils Relative %: 39 %
Platelets: 204 10*3/uL (ref 150–400)
RBC: 4.42 MIL/uL (ref 4.22–5.81)
RDW: 13.9 % (ref 11.5–15.5)
WBC: 2.7 10*3/uL — ABNORMAL LOW (ref 4.0–10.5)
nRBC: 0 % (ref 0.0–0.2)

## 2022-03-19 LAB — BASIC METABOLIC PANEL
Anion gap: 7 (ref 5–15)
BUN: 11 mg/dL (ref 6–20)
CO2: 29 mmol/L (ref 22–32)
Calcium: 9.6 mg/dL (ref 8.9–10.3)
Chloride: 105 mmol/L (ref 98–111)
Creatinine, Ser: 0.92 mg/dL (ref 0.61–1.24)
GFR, Estimated: >60 mL/min (ref >60)
Glucose, Bld: 80 mg/dL (ref 70–99)
Potassium: 3.8 mmol/L (ref 3.5–5.1)
Sodium: 141 mmol/L (ref 135–145)

## 2022-03-19 IMAGING — MR MR CERVICAL SPINE WO/W CM
4 of 8 series · 18 of 48 positions shown · IV contrast (gadavist)
Comparison: None Available.

CLINICAL DATA: Multiple sclerosis

EXAM:
MRI HEAD WITHOUT AND WITH CONTRAST
MRI CERVICAL SPINE WITHOUT AND WITH CONTRAST
TECHNIQUE: Multiplanar, multiecho pulse sequences of the brain and surrounding
structures, and cervical spine, to include the craniocervical
junction and cervicothoracic junction, were obtained without and
with intravenous contrast.
CONTRAST:  6mL GADAVIST GADOBUTROL 1 MMOL/ML IV SOLN

[Series 12: T2 · sagittal · 3.0mm · 0.43mm/px · 3 of 14 slices shown (1 of 2)]
[im 1/14]
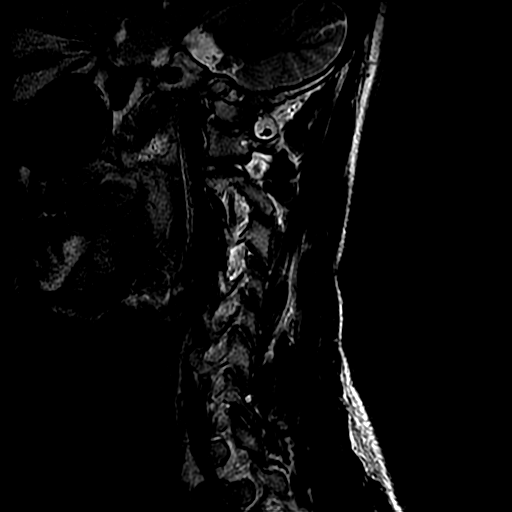
[im 7/14]
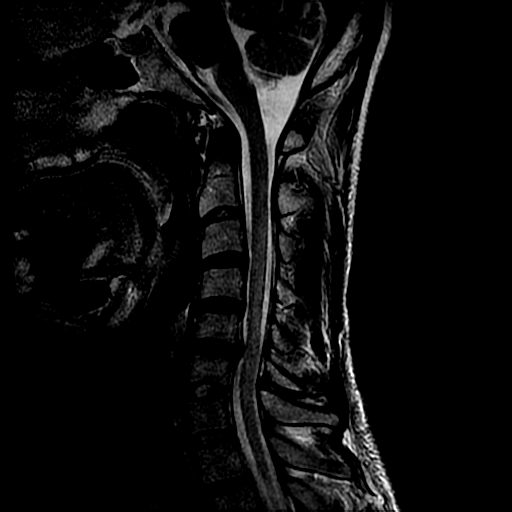
[im 14/14]
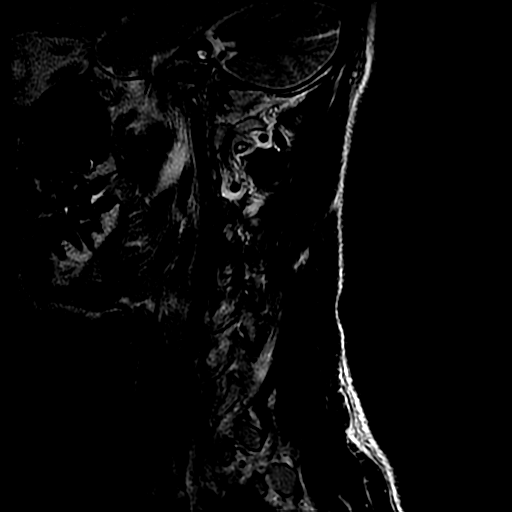

[Series 14: T1 · sagittal · 3.0mm · 0.43mm/px · 3 of 14 slices shown (1 of 2)]
[im 1/14]
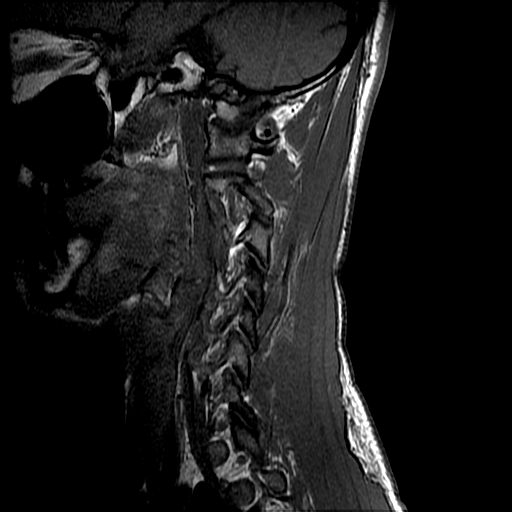
[im 9/14]
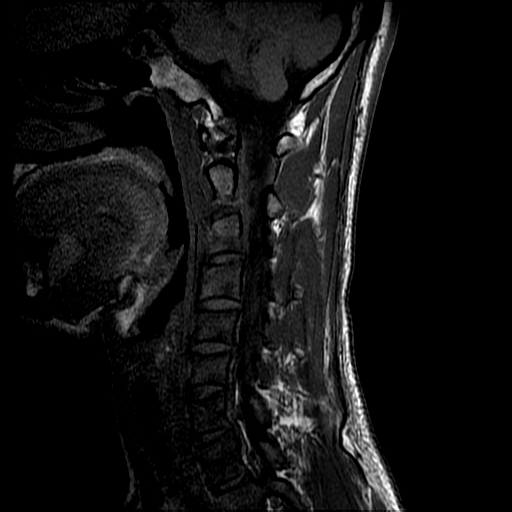
[im 14/14]
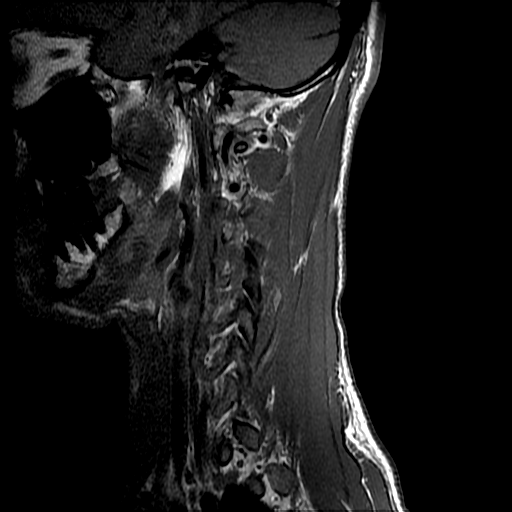

[Series 16: T2 · axial · 3.0mm · 0.39mm/px · z∈[-172,-63]mm · 9 of 33 slices shown (2 of 2)]
[im 1/33]
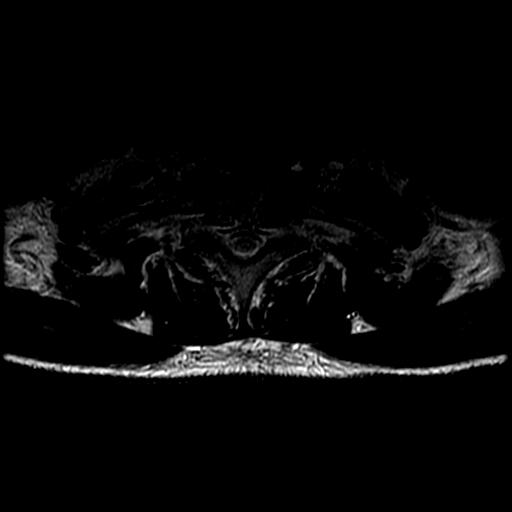
[im 5/33]
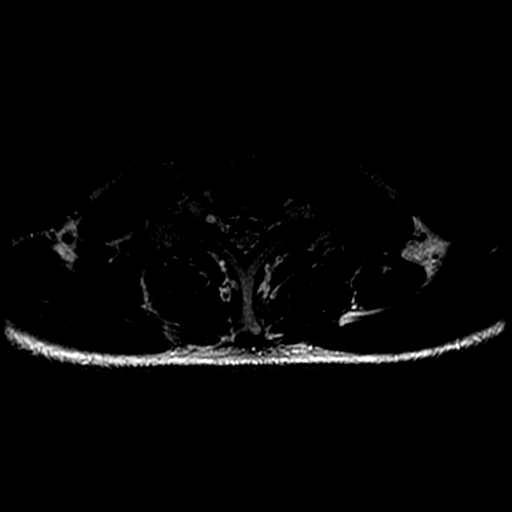
[im 9/33]
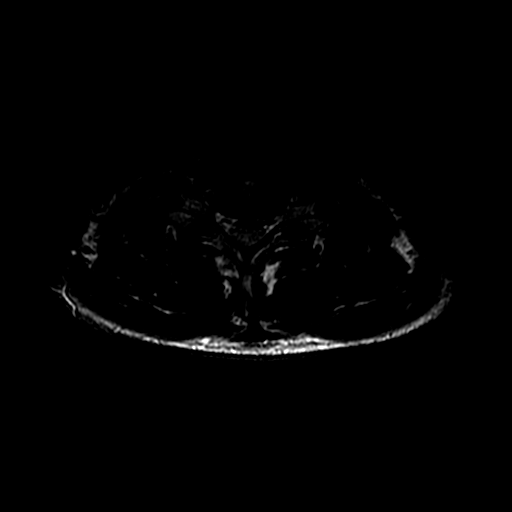
[im 13/33]
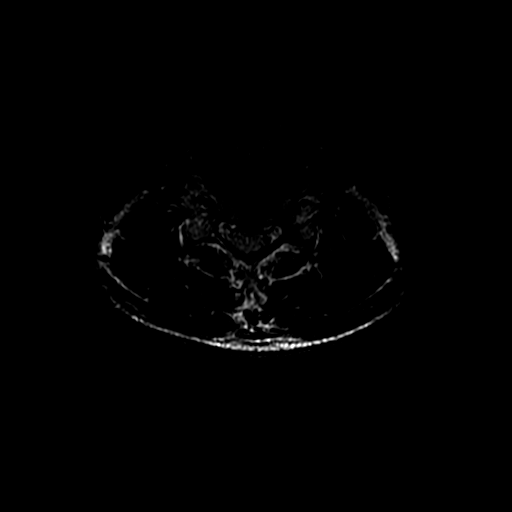
[im 17/33]
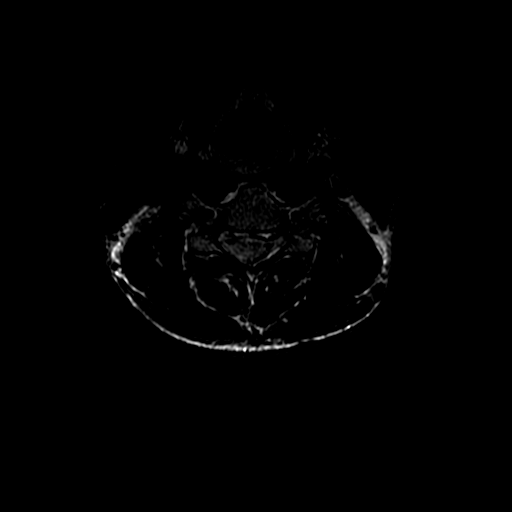
[im 21/33]
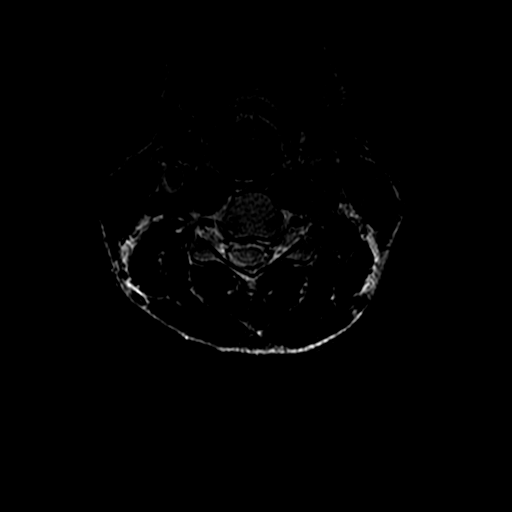
[im 25/33]
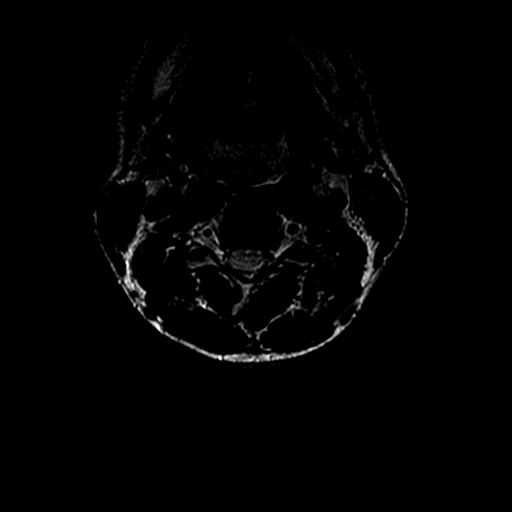
[im 29/33]
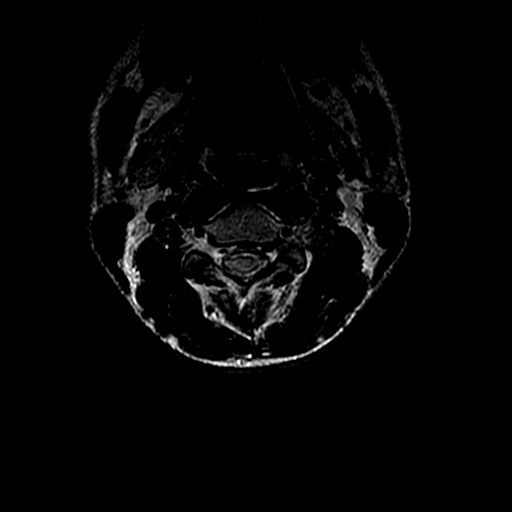
[im 33/33]
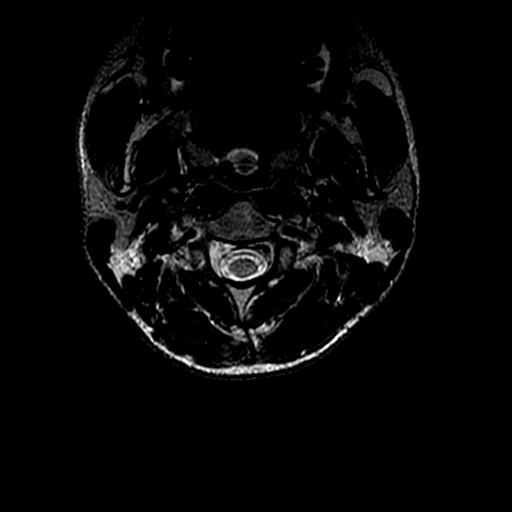

[Series 17: T1 · axial · non-contrast · 3.0mm · 0.39mm/px · z∈[-156,-79]mm · 3 of 29 slices shown (2 of 2)]
[im 5/29]
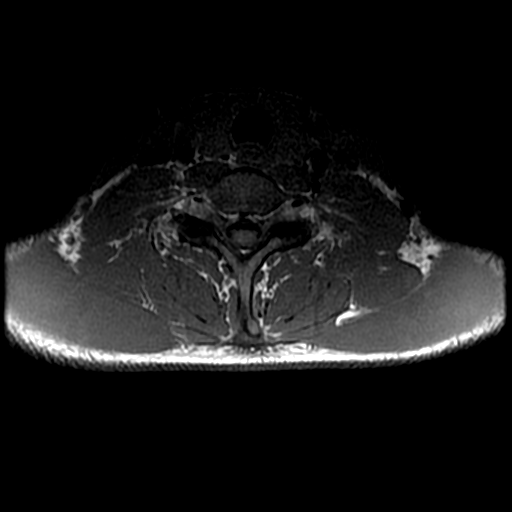
[im 17/29]
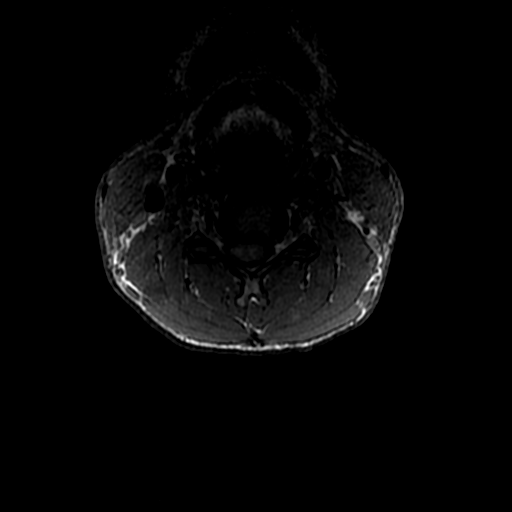
[im 25/29]
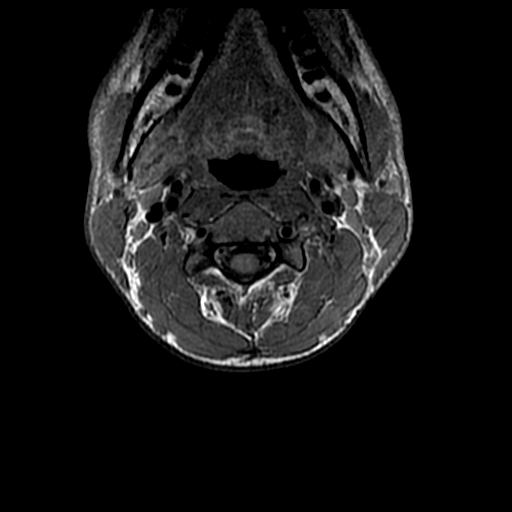

[18 of 48 positions shown; findings below may reference images not displayed]

FINDINGS: MRI HEAD FINDINGS

Brain: No restricted diffusion to suggest acute or subacute infarct.
No acute hemorrhage, mass, mass effect, or midline shift. No
hydrocephalus or extra-axial collection.

T2 hyperintense, ovoid lesions in the corpus callosum,
periventricular white matter, and juxtacortical white matter which
are largely radially oriented. Additional T2 hyperintense foci in
the pons. No definite enhancing lesions. No abnormal parenchymal or
meningeal enhancement.

Vascular: Normal arterial flow voids. Venous sinuses are patent on
postcontrast imaging.

Skull and upper cervical spine: Normal marrow signal.

Sinuses/Orbits: No acute finding.

Other: The mastoids are well aerated.

MRI CERVICAL SPINE FINDINGS

Alignment: No listhesis. Mild reversal of the normal cervical
lordosis.

Vertebrae: No acute fracture or suspicious osseous lesion.

Cord: Possible T2 hyperintense lesions in the bilateral lateral
aspects of the spinal cord at C2-C3 (series 16, image 4) and left
lateral aspect of the cord at C3-C4 (series 16, image 8). Cord is
otherwise normal in morphology and signal. No abnormal enhancement.

Posterior Fossa, vertebral arteries, paraspinal tissues: No acute
finding.

Disc levels:

C2-C3: No significant disc bulge. No spinal canal stenosis or
neuroforaminal narrowing.

C3-C4: No significant disc bulge. No spinal canal stenosis or neural
foraminal narrowing.

C4-C5: No significant disc bulge. No spinal canal stenosis or neural
foraminal narrowing.

C5-C6: Mild disc bulge with central protrusion, which indents the
ventral spinal cord. Mild-to-moderate spinal canal stenosis. No
neural foraminal narrowing.

C6-C7: No significant disc bulge. No spinal canal stenosis or
neuroforaminal narrowing.

C7-T1: No significant disc bulge. No spinal canal stenosis or
neuroforaminal narrowing.
IMPRESSION: 1. T2 hyperintense lesions in the supra and infratentorial white
matter, in a pattern consistent with multiple sclerosis. No
enhancing lesions to suggest active demyelination.
2. Possible T2 hyperintense lesions in the spinal cord, consistent
with multiple sclerosis. No definite enhancing lesions to suggest
active demyelination.
3. C5-C6 mild-to-moderate spinal canal stenosis.

## 2022-03-19 IMAGING — MR MR HEAD WO/W CM
7 of 16 series · 28 of 48 positions shown · IV contrast (gadavist)
Comparison: None Available.

CLINICAL DATA: Multiple sclerosis

EXAM:
MRI HEAD WITHOUT AND WITH CONTRAST
MRI CERVICAL SPINE WITHOUT AND WITH CONTRAST
TECHNIQUE: Multiplanar, multiecho pulse sequences of the brain and surrounding
structures, and cervical spine, to include the craniocervical
junction and cervicothoracic junction, were obtained without and
with intravenous contrast.
CONTRAST:  6mL GADAVIST GADOBUTROL 1 MMOL/ML IV SOLN

[Series 3: DWI · axial · 3.0mm · 1.09mm/px · z∈[-57,+104]mm · 6 of 112 slices shown (1 of 4)]
[im 1/112]
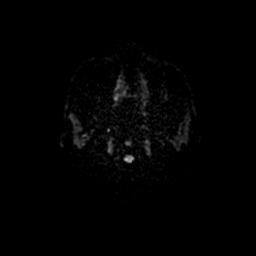
[im 23/112]
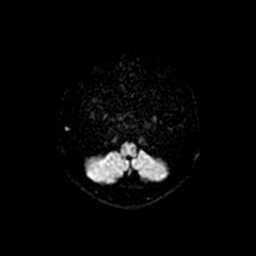
[im 45/112]
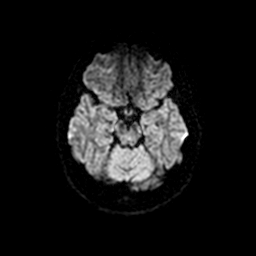
[im 67/112]
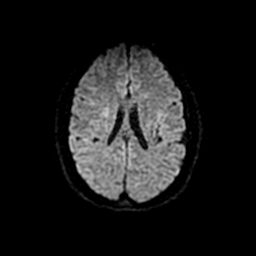
[im 89/112]
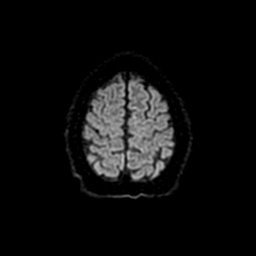
[im 112/112]
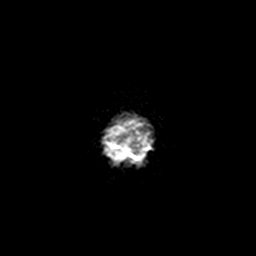

[Series 4: DWI · coronal · 5.0mm · 1.09mm/px · 4 of 78 slices shown (2 of 4)]
[im 1/78]
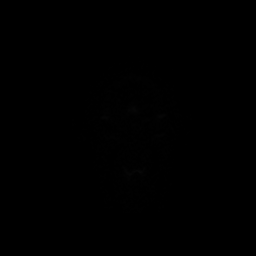
[im 26/78]
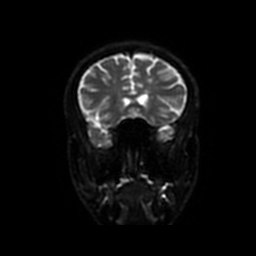
[im 52/78]
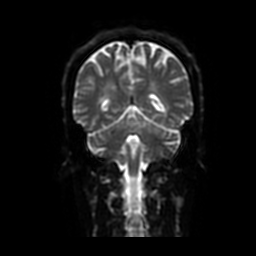
[im 78/78]
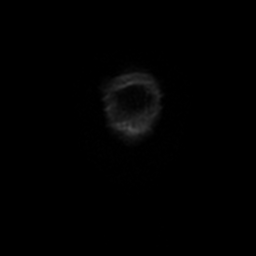

[Series 7: FLAIR · axial · 3.0mm · 0.43mm/px · 1 of 26 slices shown (1 of 2)]
[im 1/26]
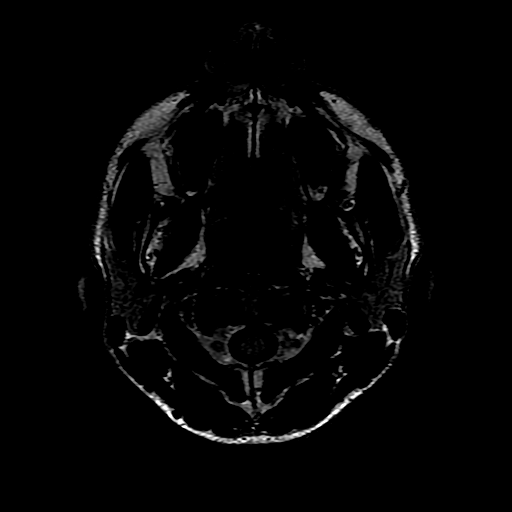

[Series 11: FLAIR · sagittal · 1.2mm · 0.49mm/px · 9 of 272 slices shown (2 of 2)]
[im 1/272]
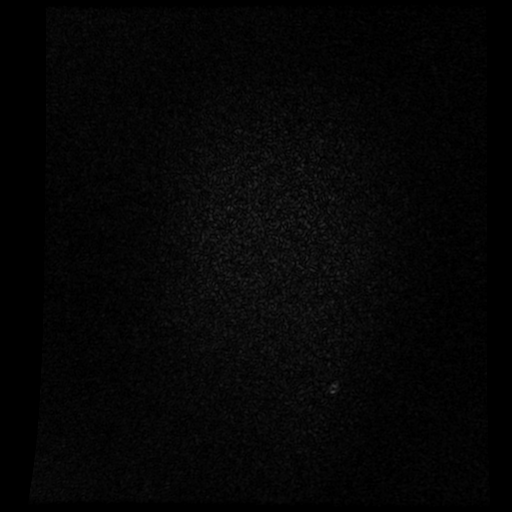
[im 46/272]
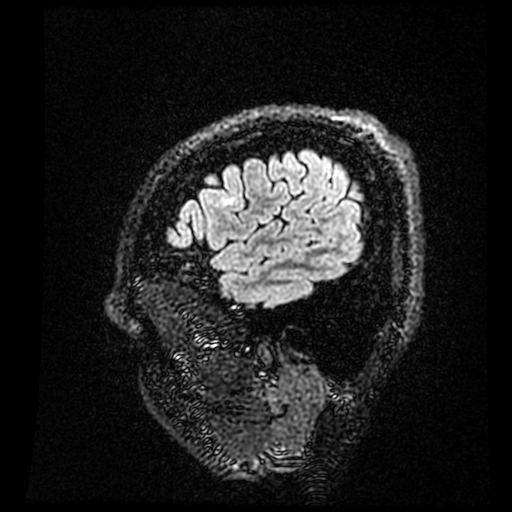
[im 91/272]
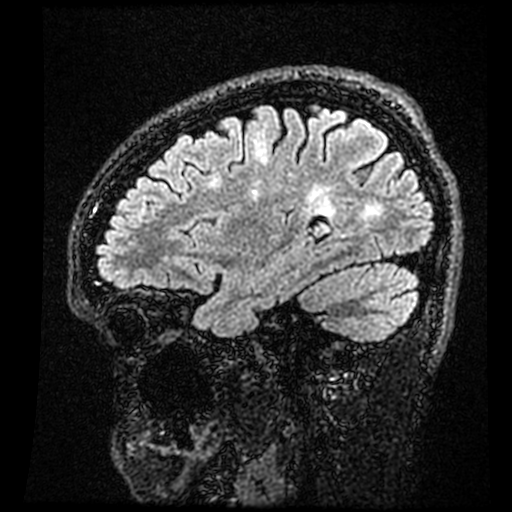
[im 113/272]
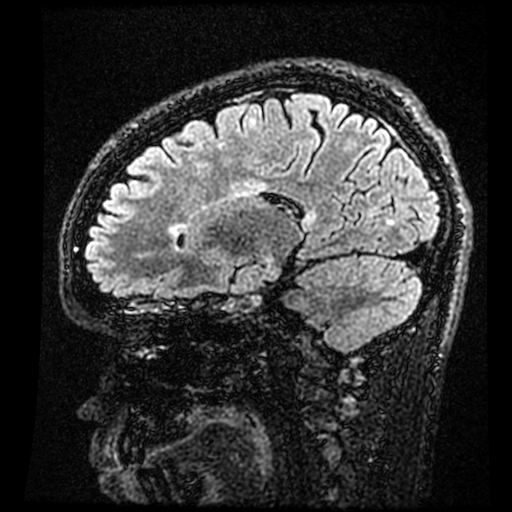
[im 136/272]
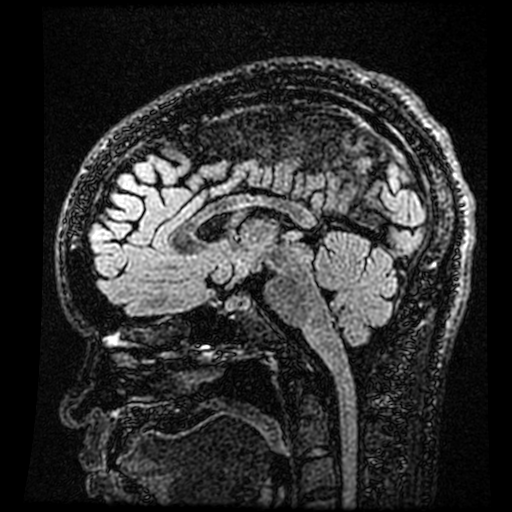
[im 159/272]
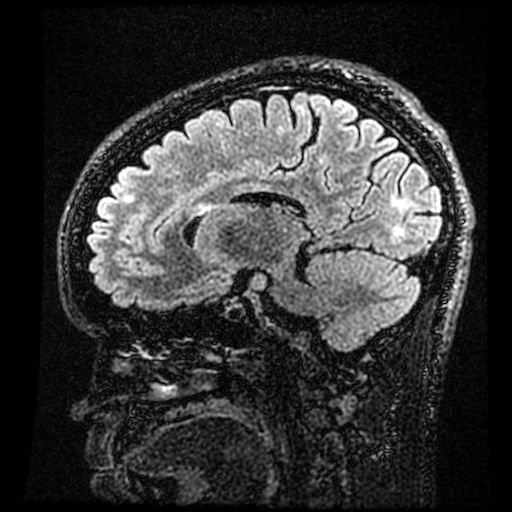
[im 181/272]
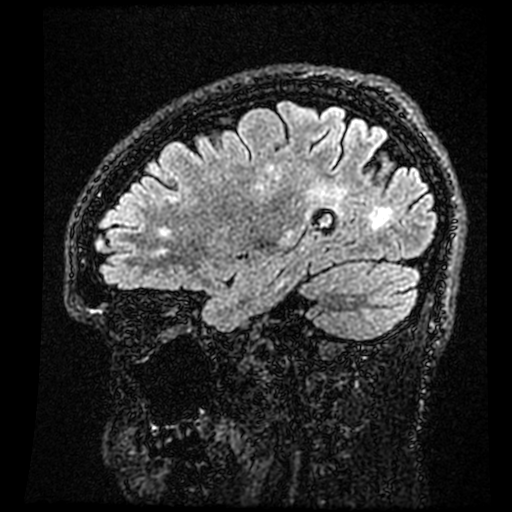
[im 226/272]
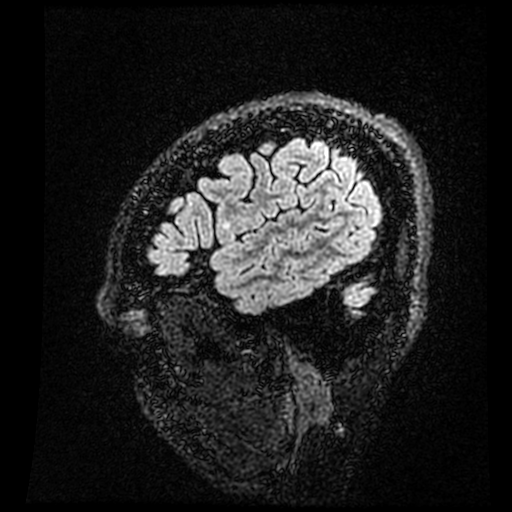
[im 272/272]
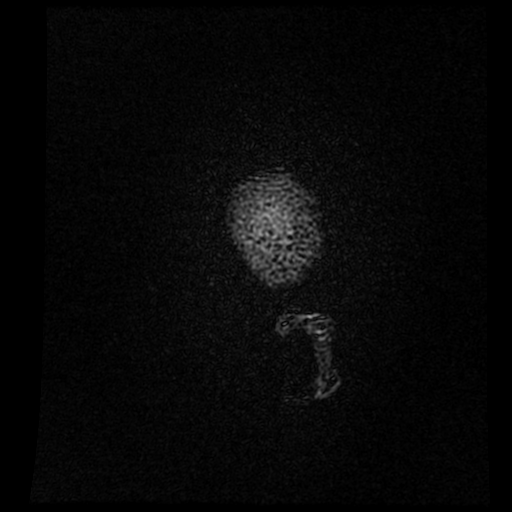

[Series 21: T1 post-contrast · axial · 3.0mm · 0.43mm/px · z∈[-55,+96]mm · 3 of 52 slices shown]
[im 1/52]
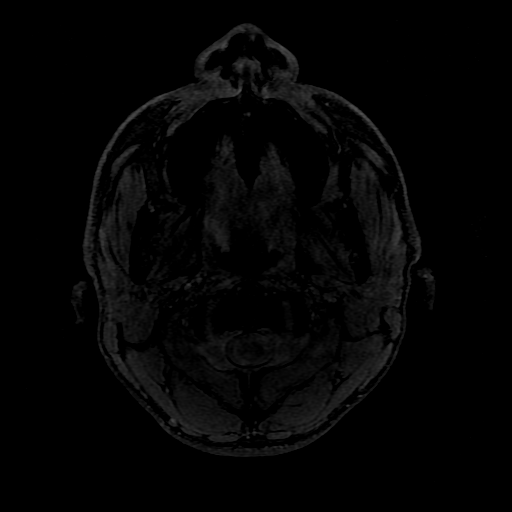
[im 26/52]
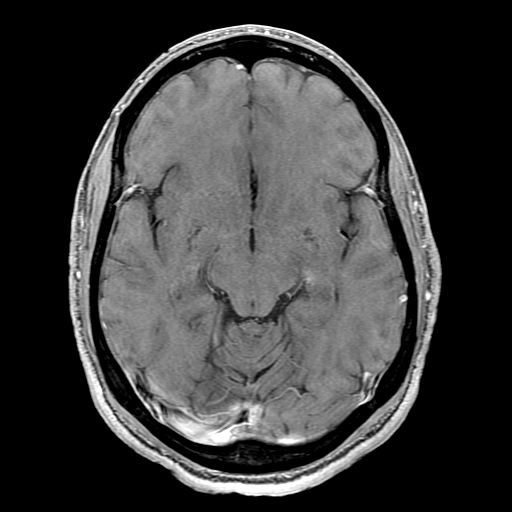
[im 52/52]
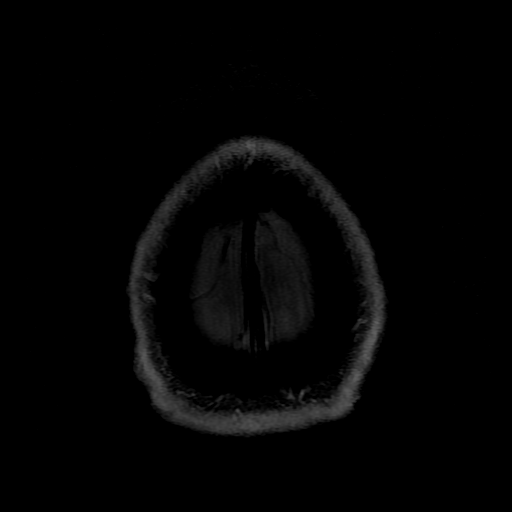

[Series 300: DWI · axial · 3.0mm · 1.09mm/px · z∈[-57,+104]mm · 3 of 56 slices shown (3 of 4)]
[im 1/56]
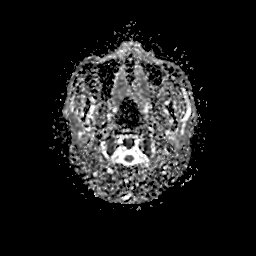
[im 28/56]
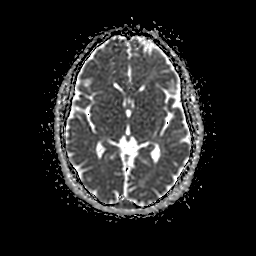
[im 56/56]
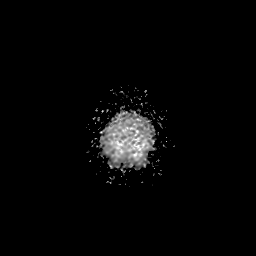

[Series 400: DWI · coronal · 5.0mm · 1.09mm/px · 2 of 38 slices shown (4 of 4)]
[im 1/38]
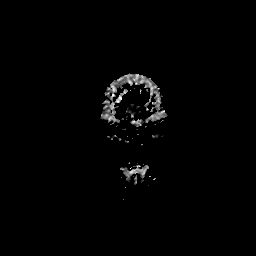
[im 38/38]
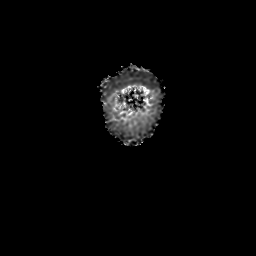

[28 of 48 positions shown; findings below may reference images not displayed]

FINDINGS: MRI HEAD FINDINGS

Brain: No restricted diffusion to suggest acute or subacute infarct.
No acute hemorrhage, mass, mass effect, or midline shift. No
hydrocephalus or extra-axial collection.

T2 hyperintense, ovoid lesions in the corpus callosum,
periventricular white matter, and juxtacortical white matter which
are largely radially oriented. Additional T2 hyperintense foci in
the pons. No definite enhancing lesions. No abnormal parenchymal or
meningeal enhancement.

Vascular: Normal arterial flow voids. Venous sinuses are patent on
postcontrast imaging.

Skull and upper cervical spine: Normal marrow signal.

Sinuses/Orbits: No acute finding.

Other: The mastoids are well aerated.

MRI CERVICAL SPINE FINDINGS

Alignment: No listhesis. Mild reversal of the normal cervical
lordosis.

Vertebrae: No acute fracture or suspicious osseous lesion.

Cord: Possible T2 hyperintense lesions in the bilateral lateral
aspects of the spinal cord at C2-C3 (series 16, image 4) and left
lateral aspect of the cord at C3-C4 (series 16, image 8). Cord is
otherwise normal in morphology and signal. No abnormal enhancement.

Posterior Fossa, vertebral arteries, paraspinal tissues: No acute
finding.

Disc levels:

C2-C3: No significant disc bulge. No spinal canal stenosis or
neuroforaminal narrowing.

C3-C4: No significant disc bulge. No spinal canal stenosis or neural
foraminal narrowing.

C4-C5: No significant disc bulge. No spinal canal stenosis or neural
foraminal narrowing.

C5-C6: Mild disc bulge with central protrusion, which indents the
ventral spinal cord. Mild-to-moderate spinal canal stenosis. No
neural foraminal narrowing.

C6-C7: No significant disc bulge. No spinal canal stenosis or
neuroforaminal narrowing.

C7-T1: No significant disc bulge. No spinal canal stenosis or
neuroforaminal narrowing.
IMPRESSION: 1. T2 hyperintense lesions in the supra and infratentorial white
matter, in a pattern consistent with multiple sclerosis. No
enhancing lesions to suggest active demyelination.
2. Possible T2 hyperintense lesions in the spinal cord, consistent
with multiple sclerosis. No definite enhancing lesions to suggest
active demyelination.
3. C5-C6 mild-to-moderate spinal canal stenosis.

## 2022-03-19 MED ORDER — GADOBUTROL 1 MMOL/ML IV SOLN
6.0000 mL | Freq: Once | INTRAVENOUS | Status: AC | PRN
  Administered 2022-03-19: 6 mL via INTRAVENOUS

## 2022-03-19 NOTE — ED Provider Notes (Signed)
Pt care assumed at 2300. Hx/o MS here for tingling.  Neuro consult pending.    Patient has been evaluated by neurology, plan to admit to medicine service for ongoing treatment for likely MS flare.  Medicine consulted for admission.   Tilden Fossa, MD 03/20/22 0300

## 2022-03-19 NOTE — ED Triage Notes (Signed)
Patient here POV from Home.  Endorses relapse of MS Symptoms. Endorses Numbness and Tingling to Left Hand and Left Foot as well as Bilateral Ear "Muffledness" Since Tuesday.   Currently no Medications for MS. No Discernable Pain  NAD Noted during Triage. A&Ox4. GCS 15. Ambulatory.

## 2022-03-19 NOTE — ED Provider Notes (Signed)
[place] EMERGENCY DEPT Provider Note   CSN: [id number] Arrival date & time: 03/19/22  1226     History  Chief Complaint  Patient presents with   Numbness    George Thomas is a 31 y.o. male.  Patient presents chief complaint of numbness tingling sensation to the left hand and left foot.  Symptoms ongoing for the past week.  He states initially it was just fingers and toes on the left side but they have progressed to include the entire left hand and her left foot.  He states he has a history of multiple sclerosis and this feels similar to prior episodes of MS flareup, however he states that generally they involve bilateral upper and lower extremities in the past.  Otherwise denies any headache or chest pain no fever no cough no vomiting no diarrhea.  He has moved from [place] recently does not have a neurologist, is scheduled for his first neurology appointment with [facility] neurology June 20.      Home Medications Prior to Admission medications   Not on File      Allergies    Patient has no allergy information on record.    Review of Systems   Review of Systems  Constitutional:  Negative for fever.  HENT:  Negative for ear pain and sore throat.   Eyes:  Negative for pain.  Respiratory:  Negative for cough.   Cardiovascular:  Negative for chest pain.  Gastrointestinal:  Negative for abdominal pain.  Genitourinary:  Negative for flank pain.  Musculoskeletal:  Negative for back pain.  Skin:  Negative for color change and rash.  Neurological:  Negative for syncope.  All other systems reviewed and are negative.  Physical Exam Updated Vital Signs BP 114/78   Pulse 70   Temp 98.5 F (36.9 C) (Temporal)   Resp 17   Ht 6' (1.829 m)   Wt 63 kg   SpO2 100%   BMI 18.85 kg/m  Physical Exam Constitutional:      Appearance: He is well-developed.  HENT:     Head: Normocephalic.     Nose: Nose normal.  Eyes:     Extraocular Movements: Extraocular  movements intact.  Cardiovascular:     Rate and Rhythm: Normal rate.  Pulmonary:     Effort: Pulmonary effort is normal.  Musculoskeletal:     Comments: Bilateral upper and lower extremities are neurovascularly intact other than paresthesias to the left hand and left foot.  Compartments are soft.  Skin:    Coloration: Skin is not jaundiced.  Neurological:     Mental Status: He is alert.     Comments: Paresthesia to left hand and left foot.  Left upper extremity strength is 5- out of 5.  Left lower extremity strength is 5- out of 5.  Cranial nerves otherwise 2 through 12 are intact and no other focal neurodeficit noted.    ED Results / Procedures / Treatments   Labs (all labs ordered are listed, but only abnormal results are displayed) Labs Reviewed  CBC WITH DIFFERENTIAL/PLATELET - Abnormal; Notable for the following components:      Result Value   WBC 2.7 (*)    Hemoglobin 12.2 (*)    Neutro Abs 1.0 (*)    All other components within normal limits  BASIC METABOLIC PANEL    EKG EKG Interpretation  Date/Time:  Monday Mar 19 2022 12:42:09 EDT Ventricular Rate:  56 PR Interval:  150 QRS Duration: 86 QT Interval:  396  QTC Calculation: 382 R Axis:   82 Text Interpretation: Sinus bradycardia with sinus arrhythmia Early repolarization Otherwise normal ECG No previous ECGs available Confirmed by Thamas Jaegers ([id number]) on 03/19/2022 1:02:43 PM  Radiology No results found.  Procedures Procedures    Medications Ordered in ED Medications - No data to display  ED Course/ Medical Decision Making/ A&P                           Medical Decision Making Amount and/or Complexity of Data Reviewed Labs: ordered. Radiology: ordered.   Labs unremarkable white count 2.7 hemoglobin 12 chemistry remarkable.  Given his history and presentation, MRI of the brain and C-spine ordered with and without contrast.  Patient will be need transportation to [facility] to complete the studies.   Will likely need neurology consultation after MRI imaging.        Final Clinical Impression(s) / ED Diagnoses Final diagnoses:  MS (multiple sclerosis) ([place])    Rx / DC Orders ED Discharge Orders     None         Luna Fuse, MD 03/19/22 1418

## 2022-03-19 NOTE — ED Notes (Signed)
Patient transported to MRI 

## 2022-03-19 NOTE — ED Notes (Signed)
Report given to [facility] charge nurse @ [facility] ED. Per MD, pt will drive self to [facility].

## 2022-03-19 NOTE — ED Notes (Signed)
Care of patient assumed, patient not present in ED, patient receiving MRI at this time.

## 2022-03-19 NOTE — ED Provider Notes (Signed)
Patient has completed MRI  Dr. Iver NestleBhagat with neurology is aware of case.  She will evaluate the patient in the ED.     Wynetta FinesMessick, Danaka Llera C, MD 03/19/22 2306

## 2022-03-19 NOTE — ED Notes (Signed)
Pt ao x 4, NAD, ambulatory. Verbalized understanding that he's going to [facility] for MRI.

## 2022-03-20 ENCOUNTER — Other Ambulatory Visit (HOSPITAL_COMMUNITY): Payer: Self-pay

## 2022-03-20 ENCOUNTER — Inpatient Hospital Stay (HOSPITAL_COMMUNITY): Payer: Medicare Other

## 2022-03-20 ENCOUNTER — Encounter (HOSPITAL_COMMUNITY): Payer: Self-pay | Admitting: Internal Medicine

## 2022-03-20 DIAGNOSIS — M069 Rheumatoid arthritis, unspecified: Secondary | ICD-10-CM | POA: Diagnosis not present

## 2022-03-20 DIAGNOSIS — G35 Multiple sclerosis: Secondary | ICD-10-CM | POA: Diagnosis not present

## 2022-03-20 LAB — CBC WITH DIFFERENTIAL/PLATELET
Abs Immature Granulocytes: 0 10*3/uL (ref 0.00–0.07)
Basophils Absolute: 0 10*3/uL (ref 0.0–0.1)
Basophils Relative: 0 %
Eosinophils Absolute: 0.1 10*3/uL (ref 0.0–0.5)
Eosinophils Relative: 4 %
HCT: 39.9 % (ref 39.0–52.0)
Hemoglobin: 12.5 g/dL — ABNORMAL LOW (ref 13.0–17.0)
Lymphocytes Relative: 38 %
Lymphs Abs: 0.6 10*3/uL — ABNORMAL LOW (ref 0.7–4.0)
MCH: 28.1 pg (ref 26.0–34.0)
MCHC: 31.3 g/dL (ref 30.0–36.0)
MCV: 89.7 fL (ref 80.0–100.0)
Monocytes Absolute: 0 10*3/uL — ABNORMAL LOW (ref 0.1–1.0)
Monocytes Relative: 1 %
Neutro Abs: 0.9 10*3/uL — ABNORMAL LOW (ref 1.7–7.7)
Neutrophils Relative %: 57 %
Platelets: 199 10*3/uL (ref 150–400)
RBC: 4.45 MIL/uL (ref 4.22–5.81)
RDW: 13.8 % (ref 11.5–15.5)
WBC: 1.6 10*3/uL — ABNORMAL LOW (ref 4.0–10.5)
nRBC: 0 % (ref 0.0–0.2)
nRBC: 0 /100 WBC

## 2022-03-20 LAB — URINALYSIS, ROUTINE W REFLEX MICROSCOPIC
Bilirubin Urine: NEGATIVE
Glucose, UA: NEGATIVE mg/dL
Hgb urine dipstick: NEGATIVE
Ketones, ur: NEGATIVE mg/dL
Leukocytes,Ua: NEGATIVE
Nitrite: NEGATIVE
Protein, ur: NEGATIVE mg/dL
Specific Gravity, Urine: 1.024 (ref 1.005–1.030)
pH: 6 (ref 5.0–8.0)

## 2022-03-20 LAB — COMPREHENSIVE METABOLIC PANEL
ALT: 25 U/L (ref 0–44)
AST: 39 U/L (ref 15–41)
Albumin: 4.3 g/dL (ref 3.5–5.0)
Alkaline Phosphatase: 55 U/L (ref 38–126)
Anion gap: 8 (ref 5–15)
BUN: 11 mg/dL (ref 6–20)
CO2: 27 mmol/L (ref 22–32)
Calcium: 9.6 mg/dL (ref 8.9–10.3)
Chloride: 103 mmol/L (ref 98–111)
Creatinine, Ser: 0.96 mg/dL (ref 0.61–1.24)
GFR, Estimated: >60 mL/min (ref >60)
Glucose, Bld: 130 mg/dL — ABNORMAL HIGH (ref 70–99)
Potassium: 4.2 mmol/L (ref 3.5–5.1)
Sodium: 138 mmol/L (ref 135–145)
Total Bilirubin: 0.6 mg/dL (ref 0.3–1.2)
Total Protein: 7.1 g/dL (ref 6.5–8.1)

## 2022-03-20 LAB — MAGNESIUM: Magnesium: 1.9 mg/dL (ref 1.7–2.4)

## 2022-03-20 LAB — HEMOGLOBIN A1C
Hgb A1c MFr Bld: 4.5 % — ABNORMAL LOW (ref 4.8–5.6)
Mean Plasma Glucose: 82.45 mg/dL

## 2022-03-20 LAB — RESP PANEL BY RT-PCR (FLU A&B, COVID) ARPGX2
Influenza A by PCR: NEGATIVE
Influenza B by PCR: NEGATIVE
SARS Coronavirus 2 by RT PCR: NEGATIVE

## 2022-03-20 LAB — LACTIC ACID, PLASMA: Lactic Acid, Venous: 1.8 mmol/L (ref 0.5–1.9)

## 2022-03-20 LAB — HIV ANTIBODY (ROUTINE TESTING W REFLEX): HIV Screen 4th Generation wRfx: NONREACTIVE

## 2022-03-20 LAB — PROCALCITONIN: Procalcitonin: <0.1 ng/mL

## 2022-03-20 LAB — GLUCOSE, CAPILLARY
Glucose-Capillary: 134 mg/dL — ABNORMAL HIGH (ref 70–99)
Glucose-Capillary: 175 mg/dL — ABNORMAL HIGH (ref 70–99)

## 2022-03-20 IMAGING — CR DG CHEST 2V
2 series · 2 of 2 positions shown · non-contrast
Comparison: None Available.

CLINICAL DATA: Encounter for pneumonia

EXAM:
CHEST - 2 VIEW

[chest pa]
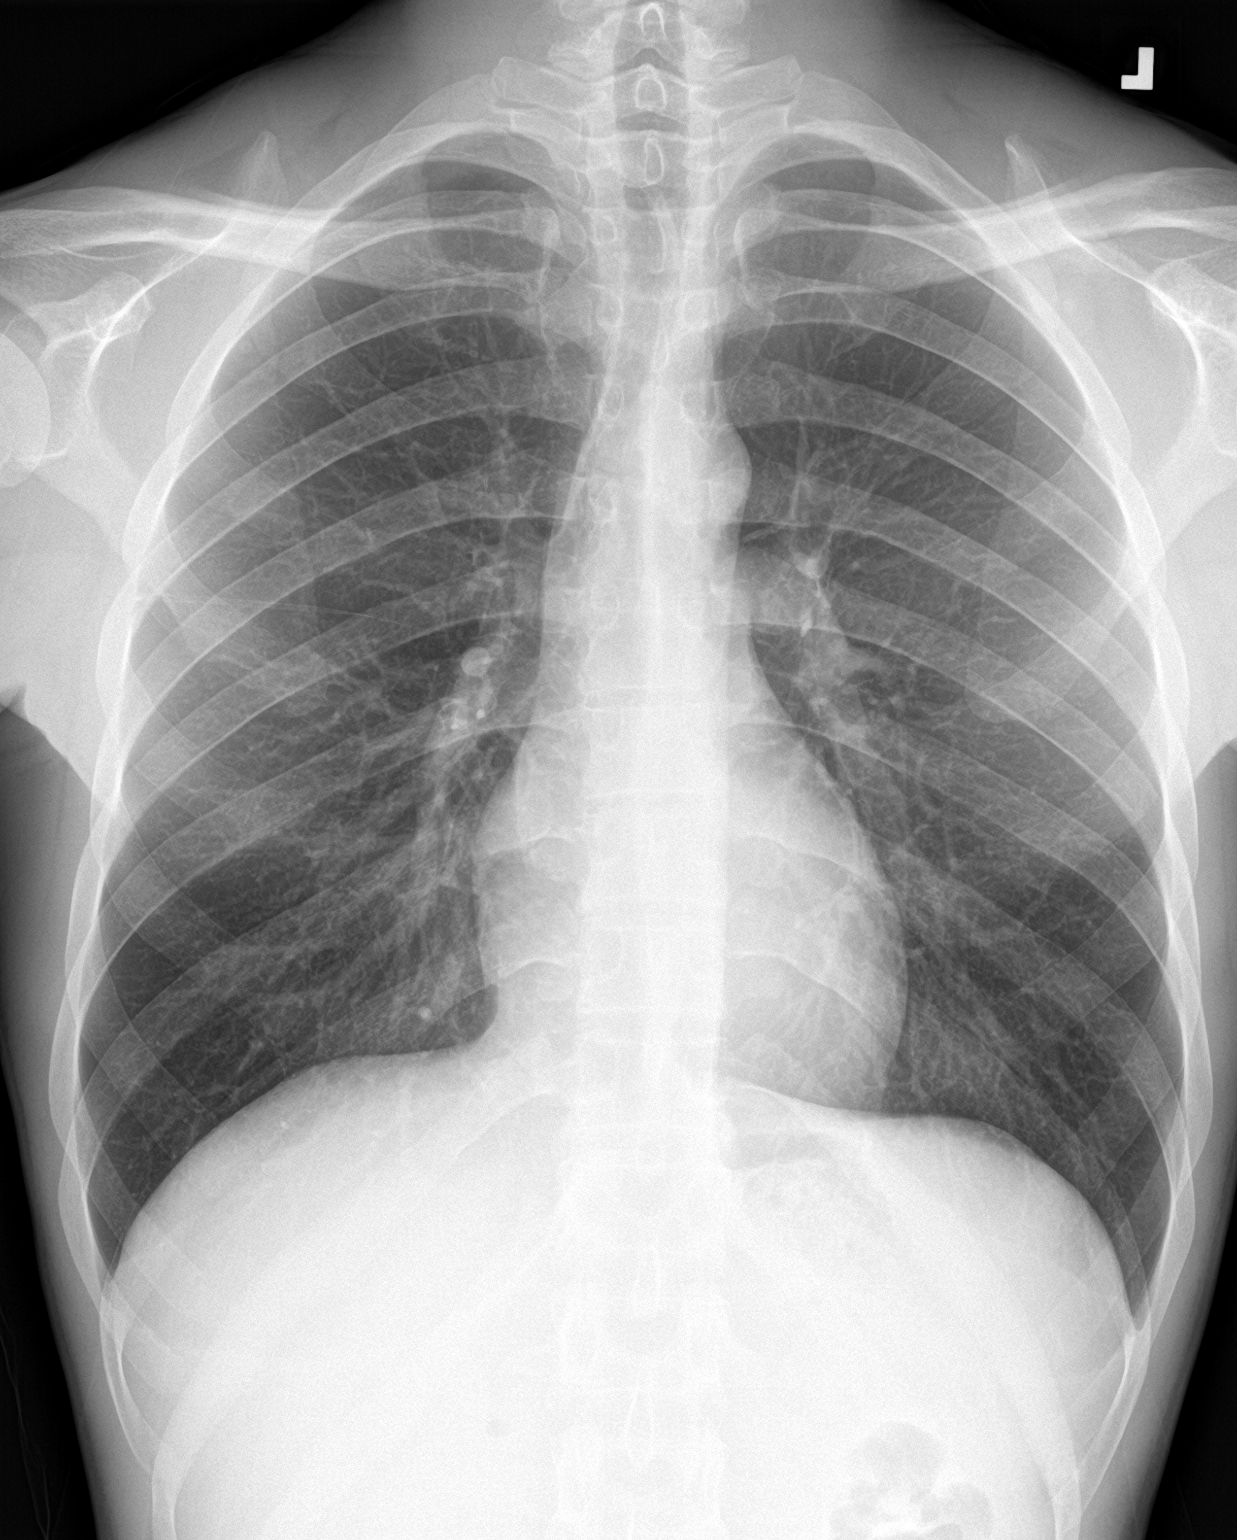

[chest lat]
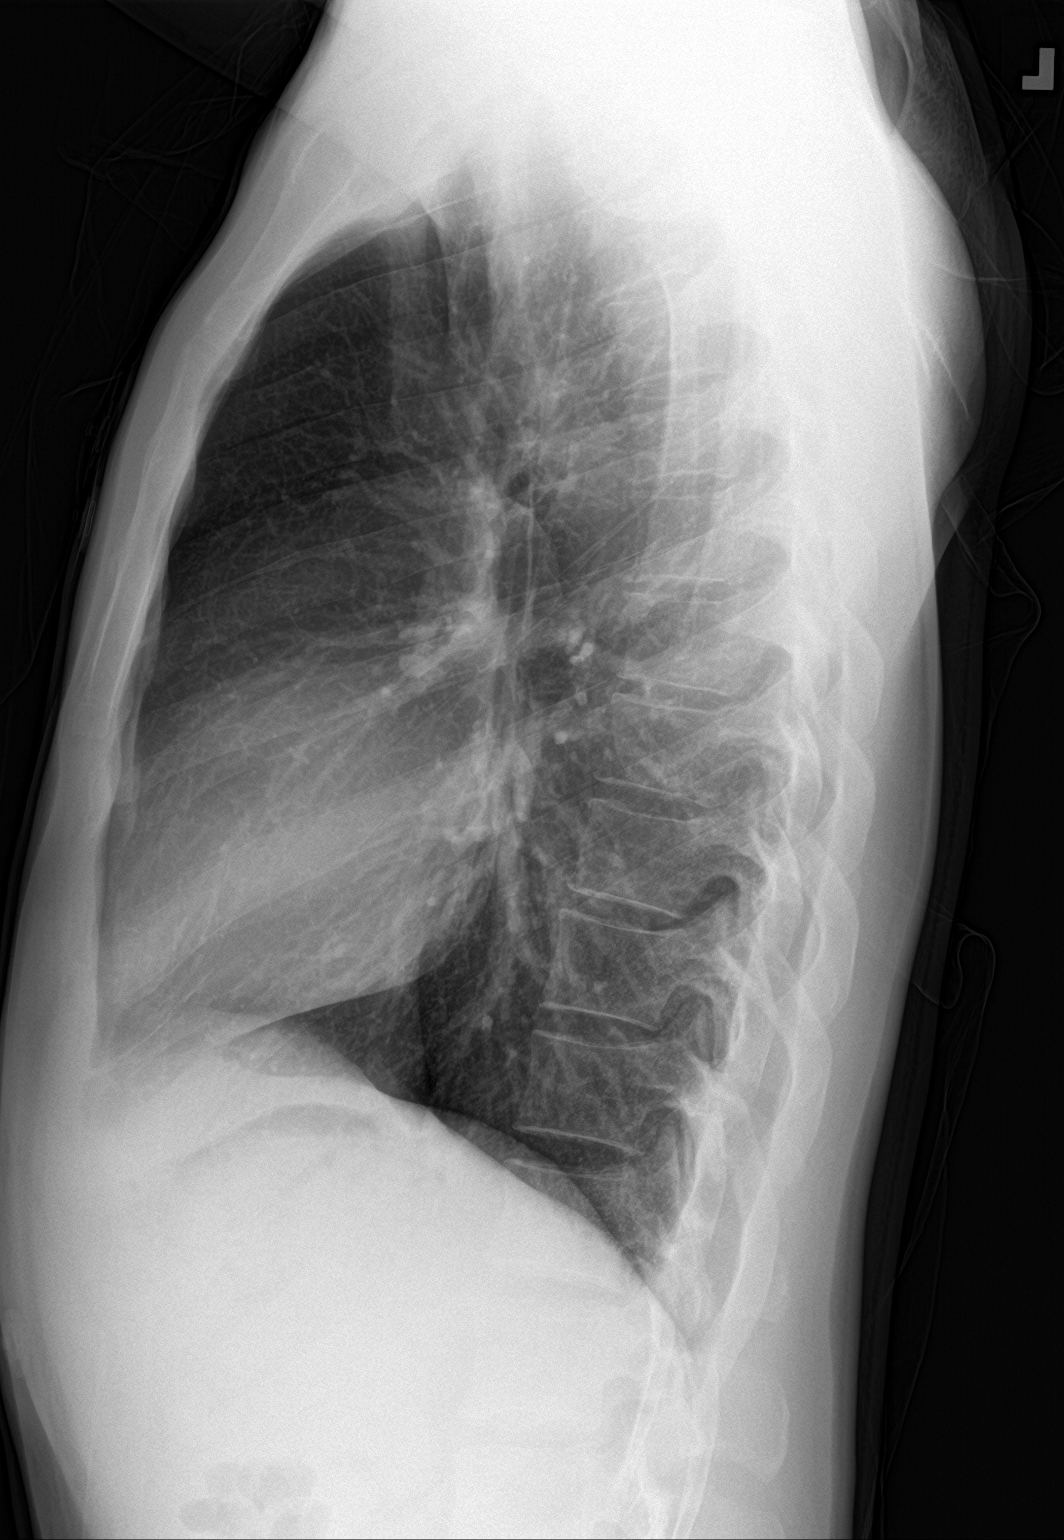

[2 of 2 positions shown; findings below may reference images not displayed]

FINDINGS: The heart size and mediastinal contours are within normal limits.
Both lungs are clear. The visualized skeletal structures are
unremarkable.
IMPRESSION: No active cardiopulmonary disease.

## 2022-03-20 MED ORDER — ENSURE ENLIVE PO LIQD
237.0000 mL | Freq: Two times a day (BID) | ORAL | Status: DC
  Administered 2022-03-20: 237 mL via ORAL

## 2022-03-20 MED ORDER — ENOXAPARIN SODIUM 40 MG/0.4ML IJ SOSY
40.0000 mg | PREFILLED_SYRINGE | Freq: Every day | INTRAMUSCULAR | Status: DC
  Administered 2022-03-20: 40 mg via SUBCUTANEOUS
  Filled 2022-03-20: qty 0.4

## 2022-03-20 MED ORDER — ONDANSETRON HCL 4 MG/2ML IJ SOLN: 4.0000 mg | Freq: Four times a day (QID) | INTRAMUSCULAR | Status: DC | PRN

## 2022-03-20 MED ORDER — ACETAMINOPHEN 650 MG RE SUPP: 650.0000 mg | Freq: Four times a day (QID) | RECTAL | Status: DC | PRN

## 2022-03-20 MED ORDER — PANTOPRAZOLE SODIUM 40 MG PO TBEC
40.0000 mg | DELAYED_RELEASE_TABLET | Freq: Every day | ORAL | Status: DC
Start: 2022-03-20 — End: 2022-03-20
  Administered 2022-03-20 (×2): 40 mg via ORAL
  Filled 2022-03-20 (×2): qty 1

## 2022-03-20 MED ORDER — INSULIN ASPART 100 UNIT/ML IJ SOLN
0.0000 [IU] | Freq: Three times a day (TID) | INTRAMUSCULAR | Status: DC
  Administered 2022-03-20: 3 [IU] via SUBCUTANEOUS
  Administered 2022-03-20: 2 [IU] via SUBCUTANEOUS

## 2022-03-20 MED ORDER — ENSURE ENLIVE PO LIQD
237.0000 mL | Freq: Three times a day (TID) | ORAL | Status: DC
  Administered 2022-03-20: 237 mL via ORAL

## 2022-03-20 MED ORDER — ONDANSETRON HCL 4 MG PO TABS: 4.0000 mg | ORAL_TABLET | Freq: Four times a day (QID) | ORAL | Status: DC | PRN

## 2022-03-20 MED ORDER — POLYETHYLENE GLYCOL 3350 17 G PO PACK: 17.0000 g | PACK | Freq: Every day | ORAL | Status: DC | PRN

## 2022-03-20 MED ORDER — PREDNISONE 50 MG PO TABS
ORAL_TABLET | ORAL | 0 refills | Status: DC
  Filled 2022-03-20: qty 75, 3d supply, fill #0

## 2022-03-20 MED ORDER — PANTOPRAZOLE SODIUM 40 MG PO TBEC: 40.0000 mg | DELAYED_RELEASE_TABLET | Freq: Every day | ORAL | Status: DC

## 2022-03-20 MED ORDER — PANTOPRAZOLE SODIUM 40 MG PO TBEC
40.0000 mg | DELAYED_RELEASE_TABLET | Freq: Two times a day (BID) | ORAL | 0 refills | Status: DC
  Filled 2022-03-20: qty 20, 10d supply, fill #0

## 2022-03-20 MED ORDER — ONDANSETRON HCL 4 MG PO TABS
4.0000 mg | ORAL_TABLET | Freq: Four times a day (QID) | ORAL | 0 refills | Status: DC | PRN
  Filled 2022-03-20: qty 20, 5d supply, fill #0

## 2022-03-20 MED ORDER — ADULT MULTIVITAMIN W/MINERALS CH
1.0000 | ORAL_TABLET | Freq: Every day | ORAL | Status: DC
  Administered 2022-03-20: 1 via ORAL
  Filled 2022-03-20: qty 1

## 2022-03-20 MED ORDER — ACETAMINOPHEN 325 MG PO TABS: 650.0000 mg | ORAL_TABLET | Freq: Four times a day (QID) | ORAL | Status: DC | PRN

## 2022-03-20 MED ORDER — HYDROXYCHLOROQUINE SULFATE 200 MG PO TABS
200.0000 mg | ORAL_TABLET | Freq: Two times a day (BID) | ORAL | Status: DC
Start: 2022-03-20 — End: 2022-03-20
  Administered 2022-03-20: 200 mg via ORAL
  Filled 2022-03-20: qty 1

## 2022-03-20 MED ORDER — SODIUM CHLORIDE 0.9 % IV SOLN
1000.0000 mg | INTRAVENOUS | Status: DC
  Administered 2022-03-20: 1000 mg via INTRAVENOUS
  Filled 2022-03-20: qty 8
  Filled 2022-03-20: qty 16

## 2022-03-20 NOTE — Progress Notes (Signed)
Initial Nutrition Assessment  DOCUMENTATION CODES:   Not applicable  INTERVENTION:   Ensure Enlive po TID, each supplement provides 350 kcal and 20 grams of protein.  MVI with minerals daily.  Greek yogurt TID with meals.   NUTRITION DIAGNOSIS:   Increased nutrient needs related to chronic illness (multiple sclerosis with recent weight loss) as evidenced by estimated needs.  GOAL:   Patient will meet greater than or equal to 90% of their needs  MONITOR:   PO intake, Supplement acceptance  REASON FOR ASSESSMENT:   Malnutrition Screening Tool    ASSESSMENT:   31 yo male admitted with relapsing remitting multiple sclerosis. PMH includes multiple sclerosis, Rheumatoid arthritis.  On admission, MRI showed no new lesions. Patient was started on high dose steroids.  Patient reports eating lunch and dinner every day PTA. He does not always eat breakfast because he is busy getting ready for work. He likes Ensure supplements. He has lost 6-8 lbs over the past few weeks, so he has been drinking Ensure supplements. Discussed the importance of adequate calorie and protein intake. Patient reports that he has always been small and has trouble gaining weight. He requested [place] yogurt with his meals and agreed to drink Ensure TID between meals. He has a good appetite, but has lost weight and lean body mass d/t acute MS flare.  Labs reviewed.  CBG: 134 this AM  Medications reviewed and include Novolog, Solumedrol.  NUTRITION - FOCUSED PHYSICAL EXAM:  Flowsheet Row Most Recent Value  Orbital Region No depletion  Upper Arm Region No depletion  Thoracic and Lumbar Region No depletion  Buccal Region Mild depletion  Temple Region Moderate depletion  Clavicle Bone Region Severe depletion  Clavicle and Acromion Bone Region Moderate depletion  Scapular Bone Region Mild depletion  Dorsal Hand Mild depletion  Patellar Region Moderate depletion  Anterior Thigh Region Moderate depletion   Posterior Calf Region Moderate depletion  Edema (RD Assessment) None  Hair Reviewed  Eyes Reviewed  Mouth Reviewed  Skin Reviewed  Nails Reviewed       Diet Order:   Diet Order             Diet regular Room service appropriate? Yes; Fluid consistency: Thin  Diet effective now                   EDUCATION NEEDS:   Education needs have been addressed  Skin:  Skin Assessment: Reviewed RN Assessment  Last BM:  5/22  Height:   Ht Readings from Last 1 Encounters:  03/19/22 6' (1.829 m)    Weight:   Wt Readings from Last 1 Encounters:  03/19/22 63 kg    BMI:  Body mass index is 18.85 kg/m.  Estimated Nutritional Needs:   Kcal:  2300-2500  Protein:  100-120 gm  Fluid:  >/= 2.2 L    Gabriel RainwaterKimberly H. RD, LDN, CNSC Please refer to Amion for contact information.

## 2022-03-20 NOTE — Care Management CC44 (Signed)
Condition Code 44 Documentation Completed  Patient Details  Name: George Thomas MRN: [id number] Date of Birth: [birth year]   Condition Code 44 given:  Yes Patient signature on Condition Code 44 notice:  Yes Documentation of 2 MD's agreement:  Yes Code 44 added to claim:  Yes    Epifanio LeschesCole, Nichola Cieslinski Hudson, RN 03/20/2022, 3:51 PM

## 2022-03-20 NOTE — Assessment & Plan Note (Signed)
   Continue home regimen of Plaquenil 

## 2022-03-20 NOTE — Plan of Care (Signed)

## 2022-03-20 NOTE — Discharge Summary (Signed)
Physician Discharge Summary   Patient: LUCIA MCCREADIE MRN: [id number] DOB: 1991/04/20  Admit date:     03/19/2022  Discharge date: 03/20/22  Discharge Physician: Alberteen Sam   PCP: Salli Real, MD     Recommendations at discharge:  Continue prednisone 1250 mg daily for 3 more days Follow up with [facility] Neurology Dr. Epimenio Foot in 6 weeks Dr. Maple Hudson: Please obtain follow up CBC later this week Follow up with new PCP in 2 weeks     Discharge Diagnoses: Principal Problem:   Relapsing remitting multiple sclerosis (HCC) Active Problems:   RA (rheumatoid arthritis) (HCC)   Multiple sclerosis (HCC)   Leukopenia       Hospital Course: Mr. Mesa is a 31 y.o. M with RA on Plaquenil, repalpsing remitting MS not currently on therapy who presented with 2 weeks new "muffled hearing" and also right hand paresthesias.  In the ER, MRI brain and c-spine was obtained that showed chronic changes of MS only.  Evaluated by Dr. Iver Nestle, Neurology who felt he was likely having an MS flare and recommended 3-5 days steroids.  He was given Solu-medrol 1000 mg daily for 2 doses and on the day of discharge he was comfortable, symptoms were improving and he was dicharged with 3 more days prednisone 1250 mg.  He has close Neurology follow up.   Leukopenia He was noted to have WBC count <2K and ANC 900. In this setting and given the plan for high dose steroids, he was recommended to obtain a CBC with his Rheumatologist who has been monitoring his CBC recently. - Recommend low threshold to refer to Hematology           The [facility] Controlled Substances Registry was reviewed for this patient prior to discharge.   Consultants: Neurology   Disposition: Home   DISCHARGE MEDICATION: Allergies as of 03/20/2022   No Known Allergies      Medication List     STOP taking these medications    Aleve 220 MG tablet Generic drug: naproxen sodium       TAKE these medications     hydroxychloroquine 200 MG tablet Commonly known as: PLAQUENIL Take 200 mg by mouth daily.   ondansetron 4 MG tablet Commonly known as: ZOFRAN Take 1 tablet (4 mg total) by mouth every 6 (six) hours as needed for nausea.   pantoprazole 40 MG tablet Commonly known as: PROTONIX Take 1 tablet (40 mg total) by mouth 2 (two) times daily before a meal.   predniSONE 50 MG tablet Commonly known as: DELTASONE Take prednisone 1250 mg (25 tablets) daily for 3 days.  You may take this in divided doses (10 tabs in morning, 10 at lunch and 5 at dinner).        Follow-up Information     [facility] Follow up on 04/05/2022.   Why: Hospital follow up and to establish primary care scheduled for 04/05/2022 at 8:45 am with Dr. Lutricia Horsfall. Office has place you on their wait list also. Please access MYCHART for earlier avalibilty if presents. Contact information: [address] [place], [place] [address] Phone: [phone]                Discharge Instructions     Discharge instructions   Complete by: As directed    From Dr. Maryfrances Bunnell: You were admitted with numbness which we think is likely an MS flare  Take steroids for the next 3 days: Take prednisone 1250 mg (  25 tablets) daily for 3 days.    You may take this in divided doses (10 tabs in morning, 10 at lunch and 5 at dinner).  While you are taking prednisone, do not take naproxen/Aleve  You may resume your Plaquenil if Dr. Maple Hudson says to.  To relieve nausea or upset stomach with the prednisone, take with food and take pantoprazole/Protonix (this is an antacid like Prilosec) twice daily for the next week Take half an hour before a meal for best effect If you have nausea, you may take ondansetron/Zofran (which is a nausea medicine)  Also, call Dr. Maple Hudson to get your blood counts checked later this week   Increase activity slowly   Complete by: As directed        Discharge Exam: Filed Weights    03/19/22 1235  Weight: 63 kg    General: Pt is alert, awake, not in acute distress Cardiovascular: RRR, nl S1-S2, no murmurs appreciated.   No LE edema.   Respiratory: Normal respiratory rate and rhythm.  CTAB without rales or wheezes. Abdominal: Abdomen soft and non-tender.  No distension or HSM.   Neuro/Psych: Strength symmetric in upper and lower extremities.  Judgment and insight appear normal.   Condition at discharge: Good  The results of significant diagnostics from this hospitalization (including imaging, microbiology, ancillary and laboratory) are listed below for reference.   Imaging Studies: DG Chest 2 View  Result Date: 03/20/2022 CLINICAL DATA:  Encounter for pneumonia EXAM: CHEST - 2 VIEW COMPARISON:  None Available. FINDINGS: The heart size and mediastinal contours are within normal limits. Both lungs are clear. The visualized skeletal structures are unremarkable. IMPRESSION: No active cardiopulmonary disease. Electronically Signed   By: Tiburcio Pea M.D.   On: 03/20/2022 04:27   MR Brain W and Wo Contrast  Result Date: 03/19/2022 CLINICAL DATA:  Multiple sclerosis EXAM: MRI HEAD WITHOUT AND WITH CONTRAST MRI CERVICAL SPINE WITHOUT AND WITH CONTRAST TECHNIQUE: Multiplanar, multiecho pulse sequences of the brain and surrounding structures, and cervical spine, to include the craniocervical junction and cervicothoracic junction, were obtained without and with intravenous contrast. CONTRAST:  6mL GADAVIST GADOBUTROL 1 MMOL/ML IV SOLN COMPARISON:  None Available. FINDINGS: MRI HEAD FINDINGS Brain: No restricted diffusion to suggest acute or subacute infarct. No acute hemorrhage, mass, mass effect, or midline shift. No hydrocephalus or extra-axial collection. T2 hyperintense, ovoid lesions in the corpus callosum, periventricular white matter, and juxtacortical white matter which are largely radially oriented. Additional T2 hyperintense foci in the pons. No definite enhancing  lesions. No abnormal parenchymal or meningeal enhancement. Vascular: Normal arterial flow voids. Venous sinuses are patent on postcontrast imaging. Skull and upper cervical spine: Normal marrow signal. Sinuses/Orbits: No acute finding. Other: The mastoids are well aerated. MRI CERVICAL SPINE FINDINGS Alignment: No listhesis. Mild reversal of the normal cervical lordosis. Vertebrae: No acute fracture or suspicious osseous lesion. Cord: Possible T2 hyperintense lesions in the bilateral lateral aspects of the spinal cord at C2-C3 (series 16, image 4) and left lateral aspect of the cord at C3-C4 (series 16, image 8). Cord is otherwise normal in morphology and signal. No abnormal enhancement. Posterior Fossa, vertebral arteries, paraspinal tissues: No acute finding. Disc levels: C2-C3: No significant disc bulge. No spinal canal stenosis or neuroforaminal narrowing. C3-C4: No significant disc bulge. No spinal canal stenosis or neural foraminal narrowing. C4-C5: No significant disc bulge. No spinal canal stenosis or neural foraminal narrowing. C5-C6: Mild disc bulge with central protrusion, which indents the ventral spinal cord. Mild-to-moderate spinal  canal stenosis. No neural foraminal narrowing. C6-C7: No significant disc bulge. No spinal canal stenosis or neuroforaminal narrowing. C7-T1: No significant disc bulge. No spinal canal stenosis or neuroforaminal narrowing. IMPRESSION: 1. T2 hyperintense lesions in the supra and infratentorial white matter, in a pattern consistent with multiple sclerosis. No enhancing lesions to suggest active demyelination. 2. Possible T2 hyperintense lesions in the spinal cord, consistent with multiple sclerosis. No definite enhancing lesions to suggest active demyelination. 3. C5-C6 mild-to-moderate spinal canal stenosis. Electronically Signed   By: Wiliam KeAlison  Vasan M.D.   On: 03/19/2022 20:34   MR Cervical Spine W or Wo Contrast  Result Date: 03/19/2022 CLINICAL DATA:  Multiple sclerosis  EXAM: MRI HEAD WITHOUT AND WITH CONTRAST MRI CERVICAL SPINE WITHOUT AND WITH CONTRAST TECHNIQUE: Multiplanar, multiecho pulse sequences of the brain and surrounding structures, and cervical spine, to include the craniocervical junction and cervicothoracic junction, were obtained without and with intravenous contrast. CONTRAST:  6mL GADAVIST GADOBUTROL 1 MMOL/ML IV SOLN COMPARISON:  None Available. FINDINGS: MRI HEAD FINDINGS Brain: No restricted diffusion to suggest acute or subacute infarct. No acute hemorrhage, mass, mass effect, or midline shift. No hydrocephalus or extra-axial collection. T2 hyperintense, ovoid lesions in the corpus callosum, periventricular white matter, and juxtacortical white matter which are largely radially oriented. Additional T2 hyperintense foci in the pons. No definite enhancing lesions. No abnormal parenchymal or meningeal enhancement. Vascular: Normal arterial flow voids. Venous sinuses are patent on postcontrast imaging. Skull and upper cervical spine: Normal marrow signal. Sinuses/Orbits: No acute finding. Other: The mastoids are well aerated. MRI CERVICAL SPINE FINDINGS Alignment: No listhesis. Mild reversal of the normal cervical lordosis. Vertebrae: No acute fracture or suspicious osseous lesion. Cord: Possible T2 hyperintense lesions in the bilateral lateral aspects of the spinal cord at C2-C3 (series 16, image 4) and left lateral aspect of the cord at C3-C4 (series 16, image 8). Cord is otherwise normal in morphology and signal. No abnormal enhancement. Posterior Fossa, vertebral arteries, paraspinal tissues: No acute finding. Disc levels: C2-C3: No significant disc bulge. No spinal canal stenosis or neuroforaminal narrowing. C3-C4: No significant disc bulge. No spinal canal stenosis or neural foraminal narrowing. C4-C5: No significant disc bulge. No spinal canal stenosis or neural foraminal narrowing. C5-C6: Mild disc bulge with central protrusion, which indents the ventral  spinal cord. Mild-to-moderate spinal canal stenosis. No neural foraminal narrowing. C6-C7: No significant disc bulge. No spinal canal stenosis or neuroforaminal narrowing. C7-T1: No significant disc bulge. No spinal canal stenosis or neuroforaminal narrowing. IMPRESSION: 1. T2 hyperintense lesions in the supra and infratentorial white matter, in a pattern consistent with multiple sclerosis. No enhancing lesions to suggest active demyelination. 2. Possible T2 hyperintense lesions in the spinal cord, consistent with multiple sclerosis. No definite enhancing lesions to suggest active demyelination. 3. C5-C6 mild-to-moderate spinal canal stenosis. Electronically Signed   By: Wiliam KeAlison  Vasan M.D.   On: 03/19/2022 20:34    Microbiology: Results for orders placed or performed during the hospital encounter of 03/19/22  Resp Panel by RT-PCR (Flu A&B, Covid) Urine, Clean Catch     Status: None   Collection Time: 03/20/22  4:01 AM   Specimen: Urine, Clean Catch; Nasopharyngeal(NP) swabs in vial transport medium  Result Value Ref Range Status   SARS Coronavirus 2 by RT PCR NEGATIVE NEGATIVE Final    Comment: (NOTE) SARS-CoV-2 target nucleic acids are NOT DETECTED.  The SARS-CoV-2 RNA is generally detectable in upper respiratory specimens during the acute phase of infection. The lowest concentration of SARS-CoV-2 viral  copies this assay can detect is 138 copies/mL. A negative result does not preclude SARS-Cov-2 infection and should not be used as the sole basis for treatment or other patient management decisions. A negative result may occur with  improper specimen collection/handling, submission of specimen other than nasopharyngeal swab, presence of viral mutation(s) within the areas targeted by this assay, and inadequate number of viral copies(<138 copies/mL). A negative result must be combined with clinical observations, patient history, and epidemiological information. The expected result is  Negative.  Fact Sheet for Patients:  BloggerCourse.com  Fact Sheet for Healthcare Providers:  SeriousBroker.it  This test is no t yet approved or cleared by the [place] FDA and  has been authorized for detection and/or diagnosis of SARS-CoV-2 by FDA under an Emergency Use Authorization (EUA). This EUA will remain  in effect (meaning this test can be used) for the duration of the COVID-19 declaration under Section 564(b)(1) of the Act, 21 U.S.C.section 360bbb-3(b)(1), unless the authorization is terminated  or revoked sooner.       Influenza A by PCR NEGATIVE NEGATIVE Final   Influenza B by PCR NEGATIVE NEGATIVE Final    Comment: (NOTE) The Xpert Xpress SARS-CoV-2/FLU/RSV plus assay is intended as an aid in the diagnosis of influenza from Nasopharyngeal swab specimens and should not be used as a sole basis for treatment. Nasal washings and aspirates are unacceptable for Xpert Xpress SARS-CoV-2/FLU/RSV testing.  Fact Sheet for Patients: BloggerCourse.com  Fact Sheet for Healthcare Providers: SeriousBroker.it  This test is not yet approved or cleared by the [place] FDA and has been authorized for detection and/or diagnosis of SARS-CoV-2 by FDA under an Emergency Use Authorization (EUA). This EUA will remain in effect (meaning this test can be used) for the duration of the COVID-19 declaration under Section 564(b)(1) of the Act, 21 U.S.C. section 360bbb-3(b)(1), unless the authorization is terminated or revoked.  Performed at [facility], [address], [place], [place] [address]   Culture, blood (Routine X 2) w Reflex to ID Panel     Status: None (Preliminary result)   Collection Time: 03/20/22  4:46 AM   Specimen: BLOOD  Result Value Ref Range Status   Specimen Description BLOOD LEFT ANTECUBITAL  Final   Special Requests AEROBIC BOTTLE ONLY Blood Culture  adequate volume  Final   Culture   Final    NO GROWTH < 12 HOURS Performed at [facility], [address] [place], [place] [address]    Report Status PENDING  Incomplete  Culture, blood (Routine X 2) w Reflex to ID Panel     Status: None (Preliminary result)   Collection Time: 03/20/22  5:14 AM   Specimen: BLOOD  Result Value Ref Range Status   Specimen Description BLOOD SITE NOT SPECIFIED  Final   Special Requests AEROBIC BOTTLE ONLY Blood Culture adequate volume  Final   Culture   Final    NO GROWTH < 12 HOURS Performed at [facility], [address], [place], [place] [address]    Report Status PENDING  Incomplete    Labs: CBC: Recent Labs  Lab 03/19/22 1333 03/20/22 0514  WBC 2.7* 1.6*  NEUTROABS 1.0* 0.9*  HGB 12.2* 12.5*  HCT 39.5 39.9  MCV 89.4 89.7  PLT 204 199   Basic Metabolic Panel: Recent Labs  Lab 03/19/22 1333 03/20/22 0514  NA 141 138  K 3.8 4.2  CL 105 103  CO2 29 27  GLUCOSE 80 130*  BUN 11 11  CREATININE 0.92 0.96  CALCIUM 9.6 9.6  MG  --  1.9   Liver Function Tests: Recent Labs  Lab 03/20/22 0514  AST 39  ALT 25  ALKPHOS 55  BILITOT 0.6  PROT 7.1  ALBUMIN 4.3   CBG: Recent Labs  Lab 03/20/22 0757 03/20/22 1146  GLUCAP 134* 175*    Discharge time spent: approximately 45 minutes spent on discharge counseling, evaluation of patient on day of discharge, and coordination of discharge planning with nursing, social work, pharmacy and case management  Signed: Alberteen Sam, MD [facility] 03/20/2022

## 2022-03-20 NOTE — Consult Note (Signed)
Neurology Consultation Reason for Consult: c/f MS flare Requesting Physician: Kristine Royal   CC: Left hand numbness and clumsiness, left foot numbness, gait instability  History is obtained from: Patient and chart review  HPI: George Thomas is a 31 y.o. right-handed male with a past medical history significant for rheumatoid arthritis on Plaquenil, relapsing remitting multiple sclerosis  He has had new muffled hearing for the past 2 weeks, as well as eye headache of the bilateral temples that is stabbing/pulsating, typically lasts a day at a time when it happens and has been ongoing for 2 weeks as well.  It is not any different with position changes, is worse with sound but not light, not associated with nausea or vomiting.  Mild improvement with an over-the-counter headache medication from 7 to 3 out of 10 in intensity.  No fevers, chills, cough, cold or other infectious symptoms, although he has been having some increased urinary incontinence for the past 2 weeks (baseline has some urgency but very infrequent incontinence).  He had some tingling in his left fingertips that then progressed to involve his entire left hand, as well as tingling in his left foot.  At baseline he has some reduced sensation on the left face arm and leg, but this tingling is new or although he has had similar tingling in different extremities at different times in the past.  He also has been unsteady on his feet more than his baseline lately, and intermittently has felt a bandlike sensation at the level of his nipples once or twice a day that is brief but has been happening for 3 or 4 days.  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  He is also concerned for some weight loss and notes that he is normally 145 pounds but has been requiring Ensure to maintain his weight at 137 to 140 pounds recently  He reports he was initially diagnosed with multiple sclerosis in 2015 when he presented with left eye vision loss.  He additionally has rheumatoid arthritis which mostly affects his elbows and knees, with his last flare happening earlier this year.  He reports he has tried multiple different medications for rheumatoid arthritis including methotrexate, Enbrel, and Humira, without good control of his rheumatoid arthritis symptoms.  He notes he is not specifically on anything for multiple sclerosis at this time.  He reports he has tolerated pulse dose steroids well in the past without any serious complications or major side effects.  ROS: All other review of systems was negative except as noted in the HPI.   Past Medical History:  Diagnosis Date   Multiple sclerosis (HCC) 2015   RA (rheumatoid arthritis) (HCC) 1998   Surgical history:  Hernia repair   No family history on file. No MS, RA or diabetes.  Social History:  reports that he has never smoked. He has never used smokeless tobacco. He reports current alcohol use. He reports that he does not use drugs. He reports he drinks socially and does not drink daily  Exam: Current vital signs: BP 122/79 (BP Location: Right Arm)   Pulse 64  Temp 98.8 F (37.1 C)   Resp 18   Ht 6' (1.829 m)   Wt 63 kg   SpO2 99%   BMI 18.85 kg/m  Vital signs in last 24 hours: Temp:  [98.5 F (36.9 C)-99 F (37.2 C)] 98.8 F (37.1 C) (05/22 2030) Pulse Rate:  [58-70] 64 (05/22 2030) Resp:  [16-18] 18 (05/22 2030) BP: (110-129)/(77-85) 122/79 (05/22 2030) SpO2:  [99 %-100 %] 99 % (05/22 2030) Weight:  [16 kg] 63 kg (05/22 1235)   Physical Exam  Constitutional: Appears well-developed and  well-nourished.  Psych: Affect appropriate to situation, calm, cooperative, mildly anxious Eyes: No scleral injection HENT: No oropharyngeal obstruction.  Negative Lhermitte sign Cardiovascular: Perfusing extremities well Respiratory: Effort normal, non-labored breathing GI: Soft.  No distension. There is no tenderness.  Skin: Warm dry and intact visible skin  Neuro: Mental Status: Patient is awake, alert, oriented to person, place, month, year, and situation. Patient is able to give a clear and coherent history. No signs of aphasia or neglect Cranial Nerves: II: Visual Fields are full. Pupils are round, and reactive to light, with a left afferent pupillary defect.   III,IV, VI: EOMI without ptosis or diploplia.  V: Facial sensation is reduced to pinprick and temperature on the left V1, V2, V3 VII: Facial movement is notable for a subtle left facial droop VIII: hearing is intact to voice, and equal on Weber testing X: Uvula elevates symmetrically XI: Shoulder shrug is symmetric. XII: tongue is midline without atrophy or fasciculations.  Motor: Tone is normal. Bulk is normal. 5/5 strength was present in the right, 4+/5 versus giveway weakness on the left.  Sensory: Sensation is reduced on the left face arm and leg compared to the right to temperature and pinprick.  On the torso he reports that things are duller above T4 on the back, but sharper below T4 in the front Deep Tendon Reflexes: 2+ and symmetric in the brachioradialis and patellae.  Cerebellar: FNF and HKS are intact on the right but ataxic on the left Gait:  Able to rise on heels and toes, but tandem gait is unsteady   I have reviewed labs in epic and the results pertinent to this consultation are:  Basic Metabolic Panel: Recent Labs  Lab 03/19/22 1333  NA 141  K 3.8  CL 105  CO2 29  GLUCOSE 80  BUN 11  CREATININE 0.92  CALCIUM 9.6    CBC: Recent Labs  Lab 03/19/22 1333  WBC 2.7*  NEUTROABS 1.0*  HGB  12.2*  HCT 39.5  MCV 89.4  PLT 204    Coagulation Studies: No results for input(s): LABPROT, INR in the last 72 hours.    I have reviewed the images obtained: MRI brain and cervical spine personally reviewed, agree that there are chronic changes consistent with multiple sclerosis but there is no clear evidence of current enhancement   Impression: 31 year old male with symptoms concerning for MS flare.  Despite negative MRI, I do think it is reasonable to treat given his history and relatively low sensitivity of spinal cord imaging in particular.  Recommendations: -UA to evaluate for possible UTI, appreciate hospitalist treatment if results consistent with infection -Pulse dose steroids 1000 mg daily for 3 to 5 days pending clinical course  -Option to transition to outpatient infusion clinic or oral steroids discussed with patient -Sliding scale insulin, moderate dose given patient is relatively thin but will also be receiving high-dose steroids; adjust as needed -Pantoprazole for GI prophylaxis -Weight  loss work-up per primary team  Brooke Dare MD-PhD [facility] [phone] Available 7 PM to 7 AM, outside of these hours please call Neurologist on call as listed on Amion.

## 2022-03-20 NOTE — Assessment & Plan Note (Signed)
   Patient presenting with a number of new neurologic complaints that have been waxing waning over the past 2 weeks  MRI work-up performed upon arrival to [facility] does not reveal any new demyelinating lesions however clinically it is felt that patient is suffering from an MS flare.   Concerning the etiology of this episode, a thorough evaluation for possible underlying infection must be undertaken and treated appropriately.    Obtaining chest x-ray, urinalysis, blood cultures and COVID-19 testing  Per neurology recommendations patient would benefit from a course of high-dose intravenous steroids which have already been initiated  Neurology has already evaluated the patient in the emergency department and provided recommendations, their assistance is appreciated.

## 2022-03-20 NOTE — Care Management Obs Status (Signed)
MEDICARE OBSERVATION STATUS NOTIFICATION   Patient Details  Name: George ReaperChristopher C Dogan MRN: [id number] Date of Birth: 1991-01-23   Medicare Observation Status Notification Given:  Yes    Epifanio LeschesCole, Florie Carico Hudson, RN 03/20/2022, 3:51 PM

## 2022-03-20 NOTE — H&P (Signed)
History and Physical    Patient: George Thomas MRN: [id number] DOA: 03/19/2022  Date of Service: the patient was seen and examined on 03/20/2022  [facility] from:  [facility]  Chief Complaint:  Chief Complaint  Patient presents with   MRI/drawbridge    HPI:   31 year old male with past medical history of rheumatoid arthritis on Plaquenil as well as relapsing and remitting multiple sclerosis presenting to [facility] emergency department as a transfer [facility] with numbness and tingling to the left hand and left foot as well as changes in his hearing.  Patient explains that for the past 2 weeks he has been experiencing what he describes as "muffled hearing."  That seems to wax and wane the symptoms have been waxing and waning over the span of time.  Patient has also been experiencing associated headache, severe in intensity and improved somewhat with taking over-the-counter analgesics.  As of late, patient additionally has been experiencing tingling of his left hand as well as his left foot.  Finally, patient has been complaining of some intermittent unsteady gait over the span of time.    Patient's persisted over the course of approximately 2 weeks which the patient felt was similar to MS flare she has had in the past.  [facility] was eventually [facility].  Upon evaluation in the emergency department due to concerns for MS flare patient was transferred to the [facility] emergency department for MRI of the brain and cervical spine as well as prompt neurology evaluation.  Upon evaluation in the [facility] emergency department patient has been evaluated by Dr. Iver Nestle with neurology.  MRI of the brain as well as cervical spine were performed revealing no new lesions, consistent with active MS however clinically neurology felt patient would benefit from hospitalization and initiation of a course of high-dose steroids.   The hospitalist group has now been called to assess the patient for admission to the hospital.    Review of Systems: Review of Systems  Neurological:  Positive for tingling and sensory change.  All other systems reviewed and are negative.   Past Medical History:  Diagnosis Date   Multiple sclerosis (HCC) 2015   RA (rheumatoid arthritis) (HCC) 1998    Past Surgical History:  Procedure Laterality Date   APPENDECTOMY  2012    Social History:  reports that he has never smoked. He has never used smokeless tobacco. He reports current alcohol use. He reports that he does not use drugs.  No Known Allergies  Family History  Problem Relation Age of Onset   Heart disease Neg Hx     Prior to Admission medications   Medication Sig Start Date End Date Taking? Authorizing Provider  hydroxychloroquine (PLAQUENIL) 200 MG tablet Take 200 mg by mouth daily.   Yes [provider]  naproxen sodium (ALEVE) 220 MG tablet Take 220 mg by mouth daily as needed (pain).   Yes [provider]    Physical Exam:  Vitals:   03/19/22 1741 03/19/22 2030 03/19/22 2230 03/20/22 0252  BP: 110/77 122/79 117/83 118/77  Pulse: (!) 58 64 68 (!) 55  Resp: Temp: 99 F (37.2 C) 98.8 F (37.1 C)  98.2 F (36.8 C)  TempSrc: Oral   Oral  SpO2: 100% 99% 97% 100%  Weight:      Height:        Constitutional: Awake alert and oriented  x3, no associated distress.   Skin: no rashes, no lesions, good skin turgor noted. Eyes: Pupils are equally reactive to light.  No evidence of scleral icterus or conjunctival pallor.  ENMT: Moist mucous membranes noted.  Posterior pharynx clear of any exudate or lesions.   Neck: normal, supple, no masses, no thyromegaly.  No evidence of jugular venous distension.   Respiratory: clear to auscultation bilaterally, no wheezing, no crackles. Normal respiratory effort. No accessory muscle use.  Cardiovascular: Regular rate and rhythm, no murmurs / rubs  / gallops. No extremity edema. 2+ pedal pulses. No carotid bruits.  Chest:   Nontender without crepitus or deformity.   Back:   Nontender without crepitus or deformity. Abdomen: Abdomen is soft and nontender.  No evidence of intra-abdominal masses.  Positive bowel sounds noted in all quadrants.   Musculoskeletal: No joint deformity upper and lower extremities. Good ROM, no contractures. Normal muscle tone.  Neurologic: CN 2-12 grossly intact. Slightly decreased sensation left hand and foot.    Patient moving all 4 extremities spontaneously.  Patient is following all commands.  Patient is responsive to verbal stimuli.   Psychiatric: Patient exhibits normal mood with appropriate affect.  Patient seems to possess insight as to their current situation.    Data Reviewed:  I have personally reviewed and interpreted labs, imaging.  Significant findings are:  CBC revealing white blood cell count of 2.7 with hemoglobin of 12.2 platelet count of 204. Chemistry revealing sodium 141, potassium 3.8, BUN 11 creatinine 0.92.  EKG: Personally reviewed.  Rhythm is sinus bradycardia with sinus arrhythmia with heart rate of 52 bpm.  No dynamic ST segment changes appreciated.   Assessment and Plan: * Relapsing remitting multiple sclerosis [facility](HCC) Patient presenting with a number of new neurologic complaints that have been waxing waning over the past 2 weeks [facility] not reveal any new demyelinating lesions however clinically it is felt that patient is suffering from an MS flare.  Concerning the etiology of this episode, a thorough evaluation for possible underlying infection must be undertaken and treated appropriately.   Obtaining chest x-ray, urinalysis, blood cultures and COVID-19 testing Per neurology recommendations patient would benefit from a course of high-dose intravenous steroids which have already been initiated Neurology has already evaluated the  patient in the emergency department and provided recommendations, their assistance is appreciated.  RA (rheumatoid arthritis) (HCC) Continue home regimen of Plaquenil.       Code Status:  Full code  code status decision has been confirmed with: patient Family Communication: deferred   Consults: Dr. Iver NestleBhagat with Neurology  Severity of Illness:  The appropriate patient status for this patient is INPATIENT. Inpatient status is judged to be reasonable and necessary in order to provide the required intensity of service to ensure the patient's safety. The patient's presenting symptoms, physical exam findings, and initial radiographic and laboratory data in the context of their chronic comorbidities is felt to place them at high risk for further clinical deterioration. Furthermore, it is not anticipated that the patient will be medically stable for discharge from the hospital within 2 midnights of admission.   * I certify that at the point of admission it is my clinical judgment that the patient will require inpatient hospital care spanning beyond 2 midnights from the point of admission due to high intensity of service, high risk for further deterioration and high frequency of surveillance required.*  Author:  Marinda ElkGeorge J Pride Gonzales MD  03/20/2022  7:06 AM

## 2022-03-20 NOTE — Plan of Care (Signed)

## 2022-03-20 NOTE — ED Notes (Signed)
Report given and care endorsed to nurse assuming care of patient in room 39.

## 2022-03-25 LAB — CULTURE, BLOOD (ROUTINE X 2)
Culture: NO GROWTH
Culture: NO GROWTH
Special Requests: ADEQUATE
Special Requests: ADEQUATE

## 2022-04-01 NOTE — Progress Notes (Addendum)
[facility]  PATIENT: George Thomas DOB: 03-03-1991  REFERRING DOCTOR OR PCP: Salli RealYun Sun MD SOURCE: Patient, notes from 03/19/2022 emergency room visit and hospital admission, imaging and lab reports, MRI images personally reviewed.  _________________________________   HISTORICAL  CHIEF COMPLAINT:  Chief Complaint  Patient presents with   New Patient (Initial Visit)    Rm 2, alone. Pt referred by Silvio ClaymanYun Sun,MD for MS eval. Pt had tingling in L foot, L hand and low hearing in ear. Sx are better after taking prednisone.     HISTORY OF PRESENT ILLNESS:  I had the pleasure of seeing your patient, George Thomas, [facility] for neurologic consultation regarding his multiple sclerosis.  He is a 31 year old man who had the onset of diplopia while driving in [id number].  When he woke up the next day, he had numbness in the left arm and hand.   The next day, he was unable to see out of the left eye and [facility] and was [facility].   He had MRI's c/w MS.   CSF was also c/w MS.   He received steroids and had improvement, though colors are desaturated and acuity is not baseline OS.   Brain MRI with supratentorial and infratentorial lesions.  MRI at the time showed T2 plaques at C2, C3-C4, C5 and C7, T1, T2, T3/4, T9 and T11.     He was placed on and Gilenya (March 2015 through 2017) but had an exacerbation with leg symptoms and also had new MRI lesions in brain.  He was then on Tysabri but had tolerability issues (02/2016 to 12/2016) so stopped.  He was started on Aubagio in 2018 but had breakthrough activity.      He has been off of disease modifying therapy for the past several years since [place] to [place] or [place].  He has since returned.  He does note that he was not always compliant with his Gilenya.  However, he was compliant with his other medications.  More recently, he woke up 03/16/2022 with reduced hearing and  tingling in his left hand and foot.   Symptoms persisted and on 03/19/2022 he went to the emergency room.  He had no definite new lesion on brain and spine MRI's 03/19/2022 but he was admitted for IV Solumedrol.   Symptoms improved over the next few days.    He is now referred for DMT initiation.    Currently, he is doing well.  He walks without assistance.  He does note balance is mildly off at times.  He continues to have reduced vision OS he denies any numbness or dysesthesias currently.  Bladder function is doing well.  Besides MS, he has rheumatoid arthritis and is on Plaquenil 200 mg daily since 2020   He finds it beneficial.   He sees Azucena FallenMichelle Young, [address].       Imaging: MRI of the brain 03/19/2022 shows T2/FLAIR hyperintense foci in the periventricular, juxtacortical and deep white matter of both cerebral hemispheres and in the pons in a pattern consistent with chronic demyelinating plaque associated with multiple sclerosis.  None of the foci enhanced after contrast.  MRI of the cervical spine 03/19/2022 shows subtle T2 hyperintense foci within the spinal cord centrally and posteriorly adjacent to C2 and to the left adjacent to C3-C4.  Normal enhancement pattern.  There is mild spinal stenosis at C5-C6 due to central disc protrusion but  no nerve root compression.  I have MRI reports but no images from multiple other scans: 10/11/2017 MRI of the brain shows multiple T2/FLAIR hyperintense foci including 1 large frontal lobe focus on the right.  Optic nerves were atrophic.  There is no new lesions.  MRI of the cervical spine 10/11/2017 showed a new (compared to 02/03/2017) and enlarging hyperintense focus within the dorsal spinal cord at C3 C5 that did not enhance.  There were multiple other lesions in the cervical thoracic spine that had been seen on the previous MRIs.  MRI of the brain 12/31/2015 showed multiple T2/FLAIR hyperintense foci consistent with MS.  There were at least 3 new nonenhancing  lesions compared to the MRI from 01/23/2015.  Laboratory: 03/20/2022: White blood cell count reduced at 1.6 (0.9 neutrophils and 0.6 lymphocytes); CMP unremarkable, HIV negative.   Reportedly, a recheck was still low late May 2023.     05/23/2018 [facility]): Hepatitis B panel, hep C antibody, HIV, TB serology were normal or unremarkable.    REVIEW OF SYSTEMS: Constitutional: No fevers, chills, sweats, or change in appetite Eyes: No visual changes, double vision, eye pain Ear, nose and throat: No hearing loss, ear pain, nasal congestion, sore throat Cardiovascular: No chest pain, palpitations Respiratory:  No shortness of breath at rest or with exertion.   No wheezes GastrointestinaI: No nausea, vomiting, diarrhea, abdominal pain, fecal incontinence Genitourinary:  No dysuria, urinary retention or frequency.  No nocturia. Musculoskeletal:  No neck pain, back pain Integumentary: No rash, pruritus, skin lesions Neurological: as above Psychiatric: No depression at this time.  No anxiety Endocrine: No palpitations, diaphoresis, change in appetite, change in weigh or increased thirst Hematologic/Lymphatic:  No anemia, purpura, petechiae. Allergic/Immunologic: No itchy/runny eyes, nasal congestion, recent allergic reactions, rashes  ALLERGIES: No Known Allergies  HOME MEDICATIONS:  Current Outpatient Medications:    hydroxychloroquine (PLAQUENIL) 200 MG tablet, Take 200 mg by mouth daily., Disp: , Rfl:   PAST MEDICAL HISTORY: Past Medical History:  Diagnosis Date   Multiple sclerosis (HCC) 2015   RA (rheumatoid arthritis) (HCC) 1998    PAST SURGICAL HISTORY: Past Surgical History:  Procedure Laterality Date   APPENDECTOMY  2012    FAMILY HISTORY: Family History  Problem Relation Age of Onset   Heart disease Neg Hx     SOCIAL HISTORY:  Social History   Socioeconomic History   Marital status: Married    Spouse name: Janelle Floor   Number of children: 1   Years of education: Not on  file   Highest education level: Bachelor's degree (e.g., BA, AB, BS)  Occupational History   Not on file  Tobacco Use   Smoking status: Never   Smokeless tobacco: Never  Substance and Sexual Activity   Alcohol use: Yes    Comment: Occasionally   Drug use: Never   Sexual activity: Not on file  Other Topics Concern   Not on file  Social History Narrative   Lives at home with wife and child   R handed   Caffeine: 1 C of coffee AM   Social Determinants of Health   Financial Resource Strain: Not on file  Food Insecurity: Not on file  Transportation Needs: Not on file  Physical Activity: Not on file  Stress: Not on file  Social Connections: Not on file  Intimate Partner Violence: Not on file     PHYSICAL EXAM  Vitals:   04/02/22 0857  BP: 107/74  Pulse: 67  Weight: 146 lb (66.2 kg)  Height: 6' (1.829 m)    Body mass index is 19.8 kg/m.  Vision Screening   Right eye Left eye Both eyes  Without correction     With correction  Comments: Wearing glasses, last eye exam over a year ago.      General: The patient is well-developed and well-nourished and in no acute distress  HEENT:  Head is [place]/AT.  Sclera are anicteric.  Funduscopic exam shows left worse than right optic nerve pallor. .  Neck: No carotid bruits are noted.  The neck is nontender.  Cardiovascular: The heart has a regular rate and rhythm with a normal S1 and S2. There were no murmurs, gallops or rubs.    Skin: Extremities are without rash or  edema.  Musculoskeletal:  Back is nontender  Neurologic Exam  Mental status: The patient is alert and oriented x 3 at the time of the examination. The patient has apparent normal recent and remote memory, with an apparently normal attention span and concentration ability.   Speech is normal.  Cranial nerves: Extraocular movements are full.  He has a 2+ left APD.  Color vision is reduced out of the left eye.  It is normal OD.  Visual fields  were grossly normal.  There is good facial sensation to soft touch bilaterally.Facial strength is normal.  Trapezius and sternocleidomastoid strength is normal. No dysarthria is noted.  The tongue is midline, and the patient has symmetric elevation of the soft palate. No obvious hearing deficits are noted.  Motor:  Muscle bulk is normal.   Tone is normal. Strength is  5 / 5 in all 4 extremities.   Sensory: Sensory testing is intact to pinprick, soft touch and vibration sensation in all 4 extremities.  Coordination: Cerebellar testing reveals good finger-nose-finger and heel-to-shin bilaterally.  Gait and station: Station is normal.   Gait is normal. Tandem gait is wide and the turn is on balance.. Romberg is negative.   Reflexes: Deep tendon reflexes are symmetric and normal bilaterally.   Plantar responses are flexor.    DIAGNOSTIC DATA (LABS, IMAGING, TESTING) - I reviewed patient records, labs, notes, testing and imaging myself where available.  Lab Results  Component Value Date   WBC 1.6 (L) 03/20/2022   HGB 12.5 (L) 03/20/2022   HCT 39.9 03/20/2022   MCV 89.7 03/20/2022   PLT 199 03/20/2022      Component Value Date/Time   NA 138 03/20/2022 0514   K 4.2 03/20/2022 0514   CL 103 03/20/2022 0514   CO2 27 03/20/2022 0514   GLUCOSE 130 (H) 03/20/2022 0514   BUN 11 03/20/2022 0514   CREATININE 0.96 03/20/2022 0514   CALCIUM 9.6 03/20/2022 0514   PROT 7.1 03/20/2022 0514   ALBUMIN 4.3 03/20/2022 0514   AST 39 03/20/2022 0514   ALT 25 03/20/2022 0514   ALKPHOS 55 03/20/2022 0514   BILITOT 0.6 03/20/2022 0514   GFRNONAA >60 03/20/2022 0514   No results found for: CHOL, HDL, LDLCALC, LDLDIRECT, TRIG, CHOLHDL Lab Results  Component Value Date   HGBA1C 4.5 (L) 03/20/2022       ASSESSMENT AND PLAN  Multiple sclerosis (HCC) - Plan: IgG, IgA, IgM, Hepatitis B surface antigen, Hepatitis B core antibody, total, Hepatitis C antibody, Hepatitis B surface antibody,qualitative,  QuantiFERON-TB Gold Plus, CBC with Differential/Platelet, Hepatic function panel, T-helper cells CD4/CD8 %, CD20 B Cells  Rheumatoid arthritis, involving unspecified site, unspecified whether rheumatoid factor present (HCC) - Plan: IgG, IgA, IgM,  Hepatitis B surface antigen, Hepatitis B core antibody, total, Hepatitis C antibody, Hepatitis B surface antibody,qualitative, QuantiFERON-TB Gold Plus, CBC with Differential/Platelet, Hepatic function panel, T-helper cells CD4/CD8 %, CD20 B Cells  High risk medication use - Plan: IgG, IgA, IgM, Hepatitis B surface antigen, Hepatitis B core antibody, total, Hepatitis C antibody, Hepatitis B surface antibody,qualitative, QuantiFERON-TB Gold Plus, CBC with Differential/Platelet, Hepatic function panel, T-helper cells CD4/CD8 %, CD20 B Cells  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 Whetstine is a 31 year old man who was diagnosed with relapsing remitting multiple sclerosis in 2013 after presenting with diplopia and optic neuritis.  He has multiple lesions in the spinal cord including one new lesion in 2018.  Therefore, his MS is more aggressive than average and I believe he would most benefit from a highly effective disease modifying therapy.  Unfortunately, he did not tolerate Tysabri.  I will consider one of the CD20 agents but would like to get a better understanding of his low white blood cell count and we will check some lab work.  Additionally, we may wish to discontinue the Plaquenil as he thinks the benefit is only mild and it could be contributing to the low lymphocyte and neutrophil counts.  I will speak to his rheumatology group this week to discuss treatment options.  We could also consider restarting one of the S1 P receptor modulators if the lymphocyte count has returned back to normal.  His failure with Gilenya could also have been  due to poor compliance at the time.  He will return to see me in 4 months or sooner for new or worsening neurologic symptoms.  Thank you for asking me to see Mr. Dutton.  Please let me know if I can be of further assistance with him for the patient's in the future.   Raymir Frommelt A. Epimenio Foot, MD, [facility] 04/02/2022, 12:11 PM Certified in Neurology, Clinical Neurophysiology, Sleep Medicine and Neuroimaging  [facility] [address], [address] [place], [place] [address] [phone]

## 2022-04-02 ENCOUNTER — Encounter: Payer: Self-pay | Admitting: Neurology

## 2022-04-02 ENCOUNTER — Ambulatory Visit (INDEPENDENT_AMBULATORY_CARE_PROVIDER_SITE_OTHER): Payer: Medicare Other | Admitting: Neurology

## 2022-04-02 VITALS — BP 107/74 | HR 67 | Ht 72.0 in | Wt 146.0 lb

## 2022-04-02 DIAGNOSIS — M069 Rheumatoid arthritis, unspecified: Secondary | ICD-10-CM

## 2022-04-02 DIAGNOSIS — M0800 Unspecified juvenile rheumatoid arthritis of unspecified site: Secondary | ICD-10-CM

## 2022-04-02 DIAGNOSIS — Z79899 Other long term (current) drug therapy: Secondary | ICD-10-CM | POA: Diagnosis not present

## 2022-04-02 DIAGNOSIS — Z8669 Personal history of other diseases of the nervous system and sense organs: Secondary | ICD-10-CM

## 2022-04-02 DIAGNOSIS — G35 Multiple sclerosis: Secondary | ICD-10-CM | POA: Diagnosis not present

## 2022-04-02 DIAGNOSIS — R260 Ataxic gait: Secondary | ICD-10-CM

## 2022-04-04 LAB — T-HELPER CELLS CD4/CD8 %
% CD 4 Pos. Lymph.: 46.4 % (ref 30.8–58.5)
Absolute CD 4 Helper: 742 /uL (ref 359–1519)
Basophils Absolute: 0 10*3/uL (ref 0.0–0.2)
Basos: 1 %
CD3+CD4+ Cells/CD3+CD8+ Cells Bld: 1.81 (ref 0.92–3.72)
CD3+CD8+ Cells # Bld: 410 /uL (ref 109–897)
CD3+CD8+ Cells NFr Bld: 25.6 % (ref 12.0–35.5)
EOS (ABSOLUTE): 0.1 10*3/uL (ref 0.0–0.4)
Eos: 2 %
Hematocrit: 40.6 % (ref 37.5–51.0)
Hemoglobin: 12.8 g/dL — ABNORMAL LOW (ref 13.0–17.7)
Immature Grans (Abs): 0 10*3/uL (ref 0.0–0.1)
Immature Granulocytes: 0 %
Lymphocytes Absolute: 1.6 10*3/uL (ref 0.7–3.1)
Lymphs: 42 %
MCH: 27.9 pg (ref 26.6–33.0)
MCHC: 31.5 g/dL (ref 31.5–35.7)
MCV: 89 fL (ref 79–97)
Monocytes Absolute: 0.2 10*3/uL (ref 0.1–0.9)
Monocytes: 5 %
Neutrophils Absolute: 1.9 10*3/uL (ref 1.4–7.0)
Neutrophils: 50 %
Platelets: 289 10*3/uL (ref 150–450)
RBC: 4.58 x10E6/uL (ref 4.14–5.80)
RDW: 13.3 % (ref 11.6–15.4)
WBC: 3.9 10*3/uL (ref 3.4–10.8)

## 2022-04-04 LAB — QUANTIFERON-TB GOLD PLUS
QuantiFERON Mitogen Value: 9.29 IU/mL
QuantiFERON Nil Value: 0 IU/mL
QuantiFERON TB1 Ag Value: 0 IU/mL
QuantiFERON TB2 Ag Value: 0 IU/mL
QuantiFERON-TB Gold Plus: NEGATIVE

## 2022-04-04 LAB — HEPATITIS B SURFACE ANTIBODY,QUALITATIVE: Hep B Surface Ab, Qual: NONREACTIVE

## 2022-04-04 LAB — HEPATIC FUNCTION PANEL
ALT: 31 IU/L (ref 0–44)
AST: 43 IU/L — ABNORMAL HIGH (ref 0–40)
Albumin: 5 g/dL (ref 4.1–5.2)
Alkaline Phosphatase: 63 IU/L (ref 44–121)
Bilirubin Total: 0.3 mg/dL (ref 0.0–1.2)
Bilirubin, Direct: 0.1 mg/dL (ref 0.00–0.40)
Total Protein: 7.6 g/dL (ref 6.0–8.5)

## 2022-04-04 LAB — HEPATITIS C ANTIBODY: Hep C Virus Ab: NONREACTIVE

## 2022-04-04 LAB — CD20 B CELLS
% CD19-B Cells: 29.6 % — ABNORMAL HIGH (ref 4.6–22.1)
% CD20-B Cells: 29.6 % — ABNORMAL HIGH (ref 5.0–22.3)

## 2022-04-04 LAB — IGG, IGA, IGM
IgA/Immunoglobulin A, Serum: 435 mg/dL — ABNORMAL HIGH (ref 90–386)
IgG (Immunoglobin G), Serum: 915 mg/dL (ref 603–1613)
IgM (Immunoglobulin M), Srm: 83 mg/dL (ref 20–172)

## 2022-04-04 LAB — HEPATITIS B CORE ANTIBODY, TOTAL: Hep B Core Total Ab: NEGATIVE

## 2022-04-04 LAB — HEPATITIS B SURFACE ANTIGEN: Hepatitis B Surface Ag: NEGATIVE

## 2022-04-05 ENCOUNTER — Ambulatory Visit (INDEPENDENT_AMBULATORY_CARE_PROVIDER_SITE_OTHER): Payer: Medicare Other | Admitting: Family Medicine

## 2022-04-05 ENCOUNTER — Encounter: Payer: Self-pay | Admitting: Family Medicine

## 2022-04-05 VITALS — BP 118/74 | HR 64 | Temp 97.2°F | Ht 72.0 in | Wt 143.8 lb

## 2022-04-05 DIAGNOSIS — M048 Other autoinflammatory syndromes: Secondary | ICD-10-CM | POA: Diagnosis not present

## 2022-04-05 DIAGNOSIS — Z13220 Encounter for screening for lipoid disorders: Secondary | ICD-10-CM | POA: Diagnosis not present

## 2022-04-05 DIAGNOSIS — Z0000 Encounter for general adult medical examination without abnormal findings: Secondary | ICD-10-CM

## 2022-04-05 DIAGNOSIS — M0800 Unspecified juvenile rheumatoid arthritis of unspecified site: Secondary | ICD-10-CM | POA: Diagnosis not present

## 2022-04-05 DIAGNOSIS — Z23 Encounter for immunization: Secondary | ICD-10-CM | POA: Diagnosis not present

## 2022-04-05 DIAGNOSIS — G35 Multiple sclerosis: Secondary | ICD-10-CM

## 2022-04-05 LAB — LIPID PANEL
Cholesterol: 196 mg/dL (ref 0–200)
HDL: 46.4 mg/dL (ref >39.00)
LDL Cholesterol: 139 mg/dL — ABNORMAL HIGH (ref 0–99)
NonHDL: 150.09
Total CHOL/HDL Ratio: 4
Triglycerides: 53 mg/dL (ref 0.0–149.0)
VLDL: 10.6 mg/dL (ref 0.0–40.0)

## 2022-04-05 LAB — TSH: TSH: 1.69 u[IU]/mL (ref 0.35–5.50)

## 2022-04-05 NOTE — Patient Instructions (Signed)
Welcome to [facility] at [place]! It was a pleasure meeting you today. ° °As discussed, Please schedule a 12 month follow up visit today. ° °PLEASE NOTE: ° °If you had any LAB tests please let us know if you have not heard back within a few days. You may see your results on MyChart before we have a chance to review them but we will give you a call once they are reviewed by us. If we ordered any REFERRALS today, please let us know if you have not heard from their office within the next week.  °Let us know through MyChart if you are needing REFILLS, or have your pharmacy send us the request. You can also use MyChart to communicate with me or any office staff. ° °Please try these tips to maintain a healthy lifestyle: ° °Eat most of your calories during the day when you are active. Eliminate processed foods including packaged sweets (pies, cakes, cookies), reduce intake of potatoes, white bread, white pasta, and white rice. Look for whole grain options, oat flour or almond flour. ° °Each meal should contain half fruits/vegetables, one quarter protein, and one quarter carbs (no bigger than a computer mouse). ° °Cut down on sweet beverages. This includes juice, soda, and sweet tea. Also watch fruit intake, though this is a healthier sweet option, it still contains natural sugar! Limit to 3 servings daily. ° °Drink at least 1 glass of water with each meal and aim for at least 8 glasses per day ° °Exercise at least 150 minutes every week.   °

## 2022-04-05 NOTE — Progress Notes (Signed)
New Patient Office Visit  Subjective:  Patient ID: George Thomas, male    DOB: 1991-04-22  Age: 31 y.o. MRN: [id number]  CC:  Chief Complaint  Patient presents with   Establish Care    Last [facility] unsure    Multiple Sclerosis    Was at ED on May 19,2023 due to flares     HPI George Thomas presents for new pt.  Moved from [place] to [facility] and then here 1 yr ago(02/06/21).  MS-seeing neuro.  Flared recently.  Given steroids in hosp and outpt.  Wbc were low so plaquinal held and waiting on WBC to inc. JRA-off plaquinal as wbc low. Seeing rheum-Michelle Young  Past Medical History:  Diagnosis Date   Multiple sclerosis (HCC) 2015   RA (rheumatoid arthritis) (HCC) 1998    Past Surgical History:  Procedure Laterality Date   APPENDECTOMY  2012   ELBOW DEBRIDEMENT Bilateral 1998   RA    Family History  Problem Relation Age of Onset   Heart disease Neg Hx     Social History   Socioeconomic History   Marital status: Married    Spouse name: Janelle Floor   Number of children: 1   Years of education: Not on file   Highest education level: Bachelor's degree (e.g., BA, AB, BS)  Occupational History   Not on file  Tobacco Use   Smoking status: Never   Smokeless tobacco: Never  Substance and Sexual Activity   Alcohol use: Yes    Comment: Occasionally   Drug use: Never   Sexual activity: Not on file  Other Topics Concern   Not on file  Social History Narrative   Lives at home with wife and child-child due in Jan 2024   R handed   Caffeine: 1 C of coffee AM   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  No ED MSK: no pain now.  But has JRA Neuro: was getting ha before MS flare.  No dizziness. Psych: no depression, anxiety, insomnia, SI   Objective:   Today's Vitals: BP 118/74   Pulse 64   Temp (!) 97.2 F (36.2 C) (Temporal)   Ht 6' (1.829 m)   Wt 143 lb 12.8 oz (65.2 kg)   SpO2 100%   BMI 19.50 kg/m   Physical Exam  Gen: WDWN NAD  AAM thin HEENT: NCAT, conjunctiva not injected, sclera nonicteric TM WNL B, OP moist, no exudates  NECK:  supple, no thyromegaly, no nodes, no carotid bruits CARDIAC: RRR, S1S2+, no murmur. DP 2+B LUNGS: CTAB. No wheezes ABDOMEN:  BS+, soft, NTND, No HSM, no masses EXT:  no edema MSK: no gross abnormalities.  NEURO: A&O x3.  CN II-XII intact.  PSYCH: normal mood. 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  Stable.  Seeing neuro.  Awaiting improvement on labs before meds JRA-off plaquenil d/t low wbc.  Feels good but had steroids.  Care per rheum.  Check TSH Wellness/screen lipids.  Tdap given  Outpatient Encounter Medications as of 04/05/2022  Medication Sig   hydroxychloroquine (PLAQUENIL) 200 MG tablet Take 200 mg by mouth daily. (Patient not taking: Reported on 04/05/2022)   No facility-administered encounter medications on file as of  04/05/2022.    Follow-up: Return in about 1 year (around 04/06/2023) for annual.   Wellington Hampshire, MD

## 2022-04-13 ENCOUNTER — Telehealth: Payer: Self-pay | Admitting: Neurology

## 2022-04-13 NOTE — Telephone Encounter (Signed)
Mr. Mahan labs are back.  He is on Plaquenil for his rheumatoid arthritis with mild benefit.  His white blood cell counts that were very low 3 weeks ago (1.6 and 2.7 are now back in the normal range (3.9.)  Lymphocyte count is now 1.6  IgG and IgM are fine.  I need to discuss his case with rheumatology as he needs to go back on a medication for MS.  My recommendation is one of the anti-CD20 agents.  These are excellent for MS and may have some benefit in rheumatoid arthritis.  I tried to speak to Azucena Fallen, PA-C at [facility] rheumatology who he sees primarily.  Their office is closed today and I will try to reach again on Monday.

## 2022-04-16 ENCOUNTER — Ambulatory Visit: Payer: Self-pay | Admitting: Neurology

## 2022-04-16 ENCOUNTER — Telehealth: Payer: Self-pay

## 2022-04-16 NOTE — Telephone Encounter (Signed)
I called patient. I discussed this with him. He would prefer to start Ocrevus. The ocrevus order will be given to our infusion suite and they will contact patient's insurance and call the patient to schedule the infusions. Pt verbalized understanding.

## 2022-04-16 NOTE — Telephone Encounter (Signed)
I spoke to Azucena Fallen, PA-C at [facility] rheumatology.  Due to breakthrough of his MS we need to have him placed on a more efficacious medication and I plan on initiating an anti-CD20 agent.  This does somewhat limit rheumatoid arthritis options, and hopefully he will not have major RA flares.

## 2022-04-16 NOTE — Telephone Encounter (Signed)
-----   Message from Asa Lente, MD sent at 04/16/2022  4:19 PM EDT ----- His lab work is back.  He can go on one of the anti-CD20 agents (Ocrevus, Briumvi, Kesimpta).

## 2022-04-17 NOTE — Telephone Encounter (Signed)
Ocrevus order signed by Dr. Epimenio Foot.  Ocrevus order given to the infusion suite for processing.

## 2022-05-07 NOTE — Telephone Encounter (Signed)
Received notice from the infusion suite that patient cannot afford the co-pay for Ocrevus.  I will attempt to send in the [facility] patient assistance form to see if this will help him.

## 2022-05-07 NOTE — Telephone Encounter (Signed)
I called patient.  He reports that his co-pay is unaffordable for Ocrevus.  I advised him I will send in the [facility] patient assistance form.  He will wait for calls from them.  I will check with him in the next 1 to 2 weeks to make sure he has received calls.  Patient assistance form has been given to Dr. Epimenio Foot for review and signature.

## 2022-05-07 NOTE — Telephone Encounter (Signed)
Faxed patient assistance application to [employer]. Received a receipt of confirmation.

## 2022-05-09 NOTE — Telephone Encounter (Signed)
Received fax from [place] that pt approved to receive Ocrevus free of charge. Gave copy to intrafusion and sent copy to be scanned to pt chart.

## 2022-05-17 ENCOUNTER — Encounter: Payer: Self-pay | Admitting: Neurology

## 2022-05-21 NOTE — Telephone Encounter (Signed)
Gave completed/signed form back to MR to process for pt. °

## 2022-05-23 NOTE — Telephone Encounter (Signed)
Received completed ppw, LVM for patient regarding $50 form fee.

## 2022-06-05 ENCOUNTER — Ambulatory Visit (INDEPENDENT_AMBULATORY_CARE_PROVIDER_SITE_OTHER): Payer: Medicare Other | Admitting: Family Medicine

## 2022-06-05 ENCOUNTER — Encounter: Payer: Self-pay | Admitting: Family Medicine

## 2022-06-05 VITALS — BP 116/70 | HR 81 | Temp 98.4°F | Ht 72.0 in | Wt 149.1 lb

## 2022-06-05 DIAGNOSIS — G4485 Primary stabbing headache: Secondary | ICD-10-CM | POA: Diagnosis not present

## 2022-06-05 NOTE — Progress Notes (Signed)
migrain

## 2022-06-05 NOTE — Patient Instructions (Signed)
Possibly occipital neuralgia  If getting intolerable, let us know and will do gabapentin or other.  Watch for rash and call if gets.

## 2022-06-05 NOTE — Progress Notes (Signed)
   Subjective:     Patient ID: George Thomas, male    DOB: 1990-11-07, 31 y.o.   MRN: [id number]  Chief Complaint  Patient presents with   Headache    Started 1 week ago, randomly throughout the day, sharp, pulsing pain, also in the back of the head and on right side only    HPI Past 1-2 wks.  Started w/HA on R side occiput.  No rad.  Sharp/piercing pain on R occiput. No injury, no rash.  No f/c.  Not sick. No n/v. No photo/phono.  No h/o migraine.  Today, not as bad.  No visual changes.  OTC meds not work.  2.  MS-starting on Ocravis in 2 wks.  3.  JRA-pt started back on plaquinel as shoulder was getting bad.    Health Maintenance Due  Topic Date Due   COVID-19 Vaccine (1) Never done    Past Medical History:  Diagnosis Date   Multiple sclerosis (HCC) 2015   RA (rheumatoid arthritis) (HCC) 1998    Past Surgical History:  Procedure Laterality Date   APPENDECTOMY  2012   ELBOW DEBRIDEMENT Bilateral 1998   RA   INGUINAL HERNIA REPAIR      Outpatient Medications Prior to Visit  Medication Sig Dispense Refill   chlorhexidine (PERIDEX) 0.12 % solution SMARTSIG:By Mouth     hydroxychloroquine (PLAQUENIL) 200 MG tablet Take 200 mg by mouth daily.     No facility-administered medications prior to visit.    No Known Allergies ROS neg/noncontributory except as noted HPI/below      Objective:     BP 116/70   Pulse 81   Temp 98.4 F (36.9 C) (Temporal)   Ht 6' (1.829 m)   Wt 149 lb 2 oz (67.6 kg)   SpO2 96%   BMI 20.22 kg/m  Wt Readings from Last 3 Encounters:  06/05/22 149 lb 2 oz (67.6 kg)  04/05/22 143 lb 12.8 oz (65.2 kg)  04/02/22 146 lb (66.2 kg)    Physical Exam   Gen: WDWN NAD HEENT: NCAT, conjunctiva not injected, sclera nonicteric.  Eomi.  Fundi not well visualized.  TM WNL B, OP moist, no exudates  NECK:  supple, no thyromegaly, no nodes, no carotid bruits CARDIAC: RRR, S1S2+, no murmur. DP 2+B EXT:  no edema MSK: ms 5/5 BUE but pain R  shoulder.  No TTP c-spine.  No TTP occipital notch NEURO: A&O x3.  CN II-XII intact. F-n normal.   PSYCH: normal mood. Good eye contact No rash scalp/neck     Assessment & Plan:   Problem List Items Addressed This Visit   None Visit Diagnoses     Primary stabbing headache    -  Primary      HA-R occiput. MRI in May-other than MS, OK.  Doubt major development since then.  Pattern most c/w occipital neuralgia type HA.  May be other.  Discussed options.  For now, will do nsaids, tylenol, topicals.  If worsening, not resolving, consider gabapentin 300mg  tid.  Consider neuro, other.  Pt will keep in touch.   No orders of the defined types were placed in this encounter.   , MD

## 2022-06-25 NOTE — Telephone Encounter (Signed)
Patient's first Ocrevus infusion was 06/18/22.

## 2022-08-22 ENCOUNTER — Ambulatory Visit: Payer: Medicare Other | Admitting: Neurology

## 2022-09-11 ENCOUNTER — Encounter: Payer: Self-pay | Admitting: Neurology

## 2022-09-11 ENCOUNTER — Ambulatory Visit (INDEPENDENT_AMBULATORY_CARE_PROVIDER_SITE_OTHER): Payer: Medicare Other | Admitting: Neurology

## 2022-09-11 VITALS — BP 105/65 | HR 65 | Ht 72.0 in | Wt 149.0 lb

## 2022-09-11 DIAGNOSIS — M069 Rheumatoid arthritis, unspecified: Secondary | ICD-10-CM | POA: Diagnosis not present

## 2022-09-11 DIAGNOSIS — G35 Multiple sclerosis: Secondary | ICD-10-CM | POA: Diagnosis not present

## 2022-09-11 DIAGNOSIS — Z79899 Other long term (current) drug therapy: Secondary | ICD-10-CM

## 2022-09-11 DIAGNOSIS — R260 Ataxic gait: Secondary | ICD-10-CM | POA: Diagnosis not present

## 2022-09-11 NOTE — Progress Notes (Signed)
[facility]  PATIENT: George Thomas DOB: 1990-12-12  REFERRING DOCTOR OR PCP: Salli Real MD SOURCE: Patient, notes from 03/19/2022 emergency room visit and hospital admission, imaging and lab reports, MRI images personally reviewed.  _________________________________   HISTORICAL  CHIEF COMPLAINT:  Chief Complaint  Patient presents with   Follow-up    Pt in room #11 and alone. Pt here toady to f/u with Ocrvus for his MS.    HISTORY OF PRESENT ILLNESS:  George Thomas is a 31 y.o. man with multiple sclerosis.  Update 09/11/2022 He started Ocrevus and tolerates it well    He denies any exacerbation or new neurologic symptom.  The left sided sensory symptoms have improved to baseline.   He has not had any new neurologic symptoms.  Gait is doing well and he keeps up with everyone.   He does not need the bannister on stairs. Good strength and sensation in limbs.    Vision is fine.   Bladder is doing well.  He denies much fatigue.  He sleeps well most nights.  Besides MS, he has rheumatoid arthritis and is on Plaquenil 200 mg daily since 2020   H feels the RA is stable.  He sees Azucena Fallen, [address]       He had labwork with rheumatology yesterday.   WBC was reduced at 2.6, normal lymphocytes 1.1, LFT ok.  CRp was increased.      MS History  He had the onset of diplopia while driving in 3299.  When he woke up the next day, he had numbness in the left arm and hand.   The next day, he was unable to see out of the left eye and went to the ED at [facility] and was admitted.   He had MRI's c/w MS.   CSF was also c/w MS.   He received steroids and had improvement, though colors are desaturated and acuity is not baseline OS.   Brain MRI with supratentorial and infratentorial lesions.  MRI at the time showed T2 plaques at C2, C3-C4, C5 and C7, T1, T2, T3/4, T9 and T11.     He was placed on and Gilenya (March 2015 through 2017) but had an exacerbation with leg symptoms and  also had new MRI lesions in brain.  He was then on Tysabri but had tolerability issues (02/2016 to 12/2016) so stopped.  He was started on Aubagio in 2018 but had breakthrough activity.      He has been off of disease modifying therapy for the past several years since he moved to [place] in 2019 or 2020.  He has since returned.  He does note that he was not always compliant with his Gilenya.  However, he was compliant with his other medications.  He woke up 03/16/2022 with reduced hearing and tingling in his left hand and foot.   Symptoms persisted and on 03/19/2022 he went to the emergency room.  He had no definite new lesion on brain and spine MRI's 03/19/2022 but he was admitted for IV Solumedrol.   Symptoms improved over the next few days.    He is now referred for DMT initiation. And OCrevus was started  4    Imaging: MRI of the brain 03/19/2022 shows T2/FLAIR hyperintense foci in the periventricular, juxtacortical and deep white matter of both cerebral hemispheres and in the pons in a pattern consistent with chronic demyelinating plaque associated with multiple sclerosis.  None of the foci enhanced after contrast.  MRI of  the cervical spine 03/19/2022 shows subtle T2 hyperintense foci within the spinal cord centrally and posteriorly adjacent to C2 and to the left adjacent to C3-C4.  Normal enhancement pattern.  There is mild spinal stenosis at C5-C6 due to central disc protrusion but no nerve root compression.  I have MRI reports but no images from multiple other scans: 10/11/2017 MRI of the brain shows multiple T2/FLAIR hyperintense foci including 1 large frontal lobe focus on the right.  Optic nerves were atrophic.  There is no new lesions.  MRI of the cervical spine 10/11/2017 showed a new (compared to 02/03/2017) and enlarging hyperintense focus within the dorsal spinal cord at C3 C5 that did not enhance.  There were multiple other lesions in the cervical thoracic spine that had been seen on the previous  MRIs.  MRI of the brain 12/31/2015 showed multiple T2/FLAIR hyperintense foci consistent with MS.  There were at least 3 new nonenhancing lesions compared to the MRI from 01/23/2015.  Laboratory: 03/20/2022: White blood cell count reduced at 1.6 (0.9 neutrophils and 0.6 lymphocytes); CMP unremarkable, HIV negative.   Reportedly, a recheck was still low late May 2023.     05/23/2018 [facility]): Hepatitis B panel, hep C antibody, HIV, TB serology were normal or unremarkable.   Anti-MOG and NMO negative      REVIEW OF SYSTEMS: Constitutional: No fevers, chills, sweats, or change in appetite Eyes: No visual changes, double vision, eye pain Ear, nose and throat: No hearing loss, ear pain, nasal congestion, sore throat Cardiovascular: No chest pain, palpitations Respiratory:  No shortness of breath at rest or with exertion.   No wheezes GastrointestinaI: No nausea, vomiting, diarrhea, abdominal pain, fecal incontinence Genitourinary:  No dysuria, urinary retention or frequency.  No nocturia. Musculoskeletal:  No neck pain, back pain Integumentary: No rash, pruritus, skin lesions Neurological: as above Psychiatric: No depression at this time.  No anxiety Endocrine: No palpitations, diaphoresis, change in appetite, change in weigh or increased thirst Hematologic/Lymphatic:  No anemia, purpura, petechiae. Allergic/Immunologic: No itchy/runny eyes, nasal congestion, recent allergic reactions, rashes  ALLERGIES: No Known Allergies  HOME MEDICATIONS:  Current Outpatient Medications:    hydroxychloroquine (PLAQUENIL) 200 MG tablet, Take 200 mg by mouth daily., Disp: , Rfl:    ocrelizumab (OCREVUS) 300 MG/10ML injection, as directed Intravenous, Disp: , Rfl:    chlorhexidine (PERIDEX) 0.12 % solution, SMARTSIG:By Mouth (Patient not taking: Reported on 09/11/2022), Disp: , Rfl:   PAST MEDICAL HISTORY: Past Medical History:  Diagnosis Date   Multiple sclerosis (HCC) 2015   RA (rheumatoid arthritis)  (HCC) 1998    PAST SURGICAL HISTORY: Past Surgical History:  Procedure Laterality Date   APPENDECTOMY  2012   ELBOW DEBRIDEMENT Bilateral 1998   RA   INGUINAL HERNIA REPAIR      FAMILY HISTORY: Family History  Problem Relation Age of Onset   Heart disease Neg Hx     SOCIAL HISTORY:  Social History   Socioeconomic History   Marital status: Married    Spouse name: Janelle Floor   Number of children: 1   Years of education: Not on file   Highest education level: Bachelor's degree (e.g., BA, AB, BS)  Occupational History   Not on file  Tobacco Use   Smoking status: Never   Smokeless tobacco: Never  Substance and Sexual Activity   Alcohol use: Yes    Comment: Occasionally   Drug use: Never   Sexual activity: Not on file  Other Topics Concern   Not on  file  Social History Narrative   Lives at home with wife and child-child due in Jan 2024   R handed   Caffeine: 1 C of coffee AM   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  No results found.    General: The patient is well-developed and well-nourished and in no acute distress  HEENT:  Head is [place]/AT.  Sclera are anicteric.   Marland Kitchen  Neck: No carotid bruits are noted.  The neck is nontender.  Cardiovascular: The heart has a regular rate and rhythm with a normal S1 and S2. There were no murmurs, gallops or rubs.    Skin: Extremities are without rash or  edema.  Musculoskeletal:  Back is nontender  Neurologic Exam  Mental status: The patient is alert and oriented x 3 at the time of the examination. The patient has  apparent normal recent and remote memory, with an apparently normal attention span and concentration ability.   Speech is normal.  Cranial nerves: Extraocular movements are full.  He has a 2+ left APD.  Color vision is reduced out of the left eye.  It is normal OD.  Visual fields were grossly normal.  There is good facial sensation to soft touch bilaterally.Facial strength is normal.  Trapezius and sternocleidomastoid strength is normal. No dysarthria is noted.  The tongue is midline, and the patient has symmetric elevation of the soft palate. No obvious hearing deficits are noted.  Motor:  Muscle bulk is normal.   Tone is normal. Strength is  5 / 5 in all 4 extremities.   Sensory: Sensory testing is intact to pinprick, soft touch and vibration sensation in all 4 extremities.  Coordination: Cerebellar testing reveals good finger-nose-finger and heel-to-shin bilaterally.  Gait and station: Station is normal.   Gait is normal.  The tandem gait is wide.  His turn is mildly off balance... Romberg is negative.   Reflexes: Deep tendon reflexes are symmetric and normal bilaterally.   Plantar responses are flexor.    DIAGNOSTIC DATA (LABS, IMAGING, TESTING) - I reviewed patient records, labs, notes, testing and imaging myself where available.  Lab Results  Component Value Date   WBC 3.9 04/02/2022   HGB 12.8 (L) 04/02/2022   HCT 40.6 04/02/2022   MCV 89 04/02/2022   PLT 289 04/02/2022      Component Value Date/Time   NA 138 03/20/2022 0514   K 4.2 03/20/2022 0514   CL 103 03/20/2022 0514   CO2 27 03/20/2022 0514   GLUCOSE 130 (H) 03/20/2022 0514   BUN 11 03/20/2022 0514   CREATININE 0.96 03/20/2022 0514   CALCIUM 9.6 03/20/2022 0514   PROT 7.6 04/02/2022 0955   ALBUMIN 5.0 04/02/2022 0955   AST 43 (H) 04/02/2022 0955   ALT 31 04/02/2022 0955   ALKPHOS 63 04/02/2022 0955   BILITOT 0.3 04/02/2022 0955   GFRNONAA >60 03/20/2022 0514   Lab Results  Component Value Date   CHOL 196  04/05/2022   HDL 46.40 04/05/2022  LDLCALC 139 (H) 04/05/2022   TRIG 53.0 04/05/2022   CHOLHDL 4 04/05/2022   Lab Results  Component Value Date   HGBA1C 4.5 (L) 03/20/2022       ASSESSMENT AND PLAN  Multiple sclerosis (HCC)  Rheumatoid arthritis, involving unspecified site, unspecified whether rheumatoid factor present (HCC)  High risk medication use  Ataxic gait    In summary, Mr. Bursch is a 32 year old man who was diagnosed with relapsing remitting multiple sclerosis in 2013 after presenting with diplopia and optic neuritis.  He has multiple lesions in the spinal cord including one new lesion in 2018.  Therefore, his MS is more aggressive than average and I believe he would most benefit from a highly effective disease modifying therapy.  Unfortunately, he did not tolerate Tysabri.  I will consider one of the CD20 agents but would like to get a better understanding of his low white blood cell count and we will check some lab work.  Additionally, we may wish to discontinue the Plaquenil as he thinks the benefit is only mild and it could be contributing to the low lymphocyte and neutrophil counts.  I will speak to his rheumatology group this week to discuss treatment options.  We could also consider restarting one of the S1 P receptor modulators if the lymphocyte count has returned back to normal.  His failure with Gilenya could also have been due to poor compliance at the time.  He will return to see me in 4 months or sooner for new or worsening neurologic symptoms.  Thank you for asking me to see Mr. Naas.  Please let me know if I can be of further assistance with him for the patient's in the future.   Syan Cullimore A. Epimenio Foot, MD, [facility] 09/11/2022, 4:53 PM Certified in Neurology, Clinical Neurophysiology, Sleep Medicine and Neuroimaging  [facility] [address] [place], [place] [address] [phone]

## 2022-10-11 ENCOUNTER — Encounter: Payer: Self-pay | Admitting: *Deleted

## 2022-10-17 ENCOUNTER — Telehealth: Payer: Self-pay | Admitting: Family Medicine

## 2022-10-17 NOTE — Telephone Encounter (Signed)
Copied from CRM [id number]. Topic: Medicare AWV >> Oct 17, 2022  9:49 AM Gwenith Spitz wrote: Reason for CRM: LVM PATIENT CALL [phone] SCHEDULE AWV WITH HEALTH COACH TINA

## 2023-01-07 ENCOUNTER — Ambulatory Visit: Payer: Medicare Other | Admitting: Family Medicine

## 2023-03-19 ENCOUNTER — Ambulatory Visit: Payer: Medicare Other | Admitting: Neurology

## 2023-03-27 ENCOUNTER — Encounter: Payer: Self-pay | Admitting: *Deleted

## 2023-03-28 NOTE — Telephone Encounter (Signed)
I called patient and left detailed message on voicemail about the my chart message that was sent. I asked pt to call me or reply to my chart message.

## 2023-04-02 NOTE — Telephone Encounter (Signed)
It appears pt has been scheduled on Monday 04/22/23 @ 2:30 pm,

## 2023-04-09 ENCOUNTER — Encounter: Payer: Medicare Other | Admitting: Family Medicine

## 2023-04-22 ENCOUNTER — Ambulatory Visit (INDEPENDENT_AMBULATORY_CARE_PROVIDER_SITE_OTHER): Payer: Medicare Other | Admitting: Neurology

## 2023-04-22 ENCOUNTER — Encounter: Payer: Self-pay | Admitting: Neurology

## 2023-04-22 VITALS — BP 126/82 | HR 72 | Ht 72.0 in | Wt 148.5 lb

## 2023-04-22 DIAGNOSIS — G35 Multiple sclerosis: Secondary | ICD-10-CM | POA: Diagnosis not present

## 2023-04-22 DIAGNOSIS — M069 Rheumatoid arthritis, unspecified: Secondary | ICD-10-CM

## 2023-04-22 DIAGNOSIS — Z8669 Personal history of other diseases of the nervous system and sense organs: Secondary | ICD-10-CM

## 2023-04-22 DIAGNOSIS — Z79899 Other long term (current) drug therapy: Secondary | ICD-10-CM

## 2023-04-22 MED ORDER — BUPROPION HCL ER (XL) 150 MG PO TB24: 150.0000 mg | ORAL_TABLET | Freq: Every day | ORAL | 3 refills | Status: DC

## 2023-04-22 NOTE — Progress Notes (Signed)
[facility]  PATIENT: George Thomas DOB: 03/24/1991  REFERRING DOCTOR OR PCP: Salli Real MD SOURCE: Patient, notes from 03/19/2022 emergency room visit and hospital admission, imaging and lab reports, MRI images personally reviewed.  _________________________________   HISTORICAL  CHIEF COMPLAINT:  Chief Complaint  Patient presents with   Room 11    Pt is here Alone. Pt stats that he is having a hard time staying focused lately. Pt states that he doesn't have any muscle weakness in his arms or legs. Pt states that he doesn't have any burning or tingling sensations in his legs or feet.      HISTORY OF PRESENT ILLNESS:  George Thomas is a 32 y.o. man with multiple sclerosis.  Update 04/22/2023 He started Ocrevus and tolerates it well   Hs next dose will be in June 17, 2023   He denies any exacerbation or new neurologic symptom.     He is able to get free drug.    Gait is doing well and he keeps up with everyone.   He does not need the bannister on stairs but usually has it near the rail just in case. Good strength and sensation in limbs.      Vision is fine.   He notes some vertigo, especially after he moves certain ways  Bladder is doing well.    He has so fatigue.   He is noting more trouble focusing especially for more complex or longer tasks.  He is often behind at work.   In the past,  a previous neurologist had prescribed Wellbutrin but he had not started.    He was prescribed Provigil in 2015 but not sure if he ever took it.  He has had some depression and apathy.  Besides MS, he has rheumatoid arthritis and is on Plaquenil 200 mg daily since 2020   He feels the RA is stable.  He sees Azucena Fallen, [address]     He notes fewer flare-ups since starting Ocrevus.   He has had some low WBC counts in past.      MS History  He had the onset of diplopia while driving in [address]8.  When he woke up the next day, he had numbness in the left arm and hand.   The  next day, he was unable to see out of the left eye and went to the ED at [facility] and was admitted.   He had MRI's c/w MS.   CSF was also c/w MS.   He received steroids and had improvement, though colors are desaturated and acuity is not baseline OS.   Brain MRI with supratentorial and infratentorial lesions.  MRI at the time showed T2 plaques at C2, C3-C4, C5 and C7, T1, T2, T3/4, T9 and T11.     He was placed on and Gilenya (March 2015 through 2017) but had an exacerbation with leg symptoms and also had new MRI lesions in brain.  He was then on Tysabri but had tolerability issues (02/2016 to 12/2016) so stopped.  He was started on Aubagio in 2018 but had breakthrough activity.      He has been off of disease modifying therapy for the past several years since he moved to [place] in 2019 or 2020.  He has since returned.  He does note that he was not always compliant with his Gilenya.  However, he was compliant with his other medications.  He woke up 03/16/2022 with reduced hearing and tingling in his  left hand and foot.   Symptoms persisted and on 03/19/2022 he went to the emergency room.  He had no definite new lesion on brain and spine MRI's 03/19/2022 but he was admitted for IV Solumedrol.   Symptoms improved over the next few days.    He is now referred for DMT initiation. And OCrevus was started  4    Imaging: MRI of the brain 03/19/2022 shows T2/FLAIR hyperintense foci in the periventricular, juxtacortical and deep white matter of both cerebral hemispheres and in the pons in a pattern consistent with chronic demyelinating plaque associated with multiple sclerosis.  None of the foci enhanced after contrast.  MRI of the cervical spine 03/19/2022 shows subtle T2 hyperintense foci within the spinal cord centrally and posteriorly adjacent to C2 and to the left adjacent to C3-C4.  Normal enhancement pattern.  There is mild spinal stenosis at C5-C6 due to central disc protrusion but no nerve root compression.  I have  MRI reports but no images from multiple other scans: 10/11/2017 MRI of the brain shows multiple T2/FLAIR hyperintense foci including 1 large frontal lobe focus on the right.  Optic nerves were atrophic.  There is no new lesions.  MRI of the cervical spine 10/11/2017 showed a new (compared to 02/03/2017) and enlarging hyperintense focus within the dorsal spinal cord at C3 C5 that did not enhance.  There were multiple other lesions in the cervical thoracic spine that had been seen on the previous MRIs.  MRI of the brain 12/31/2015 showed multiple T2/FLAIR hyperintense foci consistent with MS.  There were at least 3 new nonenhancing lesions compared to the MRI from 01/23/2015.  Laboratory: 03/20/2022: White blood cell count reduced at 1.6 (0.9 neutrophils and 0.6 lymphocytes); CMP unremarkable, HIV negative.   Reportedly, a recheck was still low late May 2023.     05/23/2018 [facility]): Hepatitis B panel, hep C antibody, HIV, TB serology were normal or unremarkable.   Anti-MOG and NMO negative      REVIEW OF SYSTEMS: Constitutional: No fevers, chills, sweats, or change in appetite Eyes: No visual changes, double vision, eye pain Ear, nose and throat: No hearing loss, ear pain, nasal congestion, sore throat Cardiovascular: No chest pain, palpitations Respiratory:  No shortness of breath at rest or with exertion.   No wheezes GastrointestinaI: No nausea, vomiting, diarrhea, abdominal pain, fecal incontinence Genitourinary:  No dysuria, urinary retention or frequency.  No nocturia. Musculoskeletal:  No neck pain, back pain Integumentary: No rash, pruritus, skin lesions Neurological: as above Psychiatric: No depression at this time.  No anxiety Endocrine: No palpitations, diaphoresis, change in appetite, change in weigh or increased thirst Hematologic/Lymphatic:  No anemia, purpura, petechiae. Allergic/Immunologic: No itchy/runny eyes, nasal congestion, recent allergic reactions, rashes  ALLERGIES: No  Known Allergies  HOME MEDICATIONS:  Current Outpatient Medications:    buPROPion (WELLBUTRIN XL) 150 MG 24 hr tablet, Take 1 tablet (150 mg total) by mouth daily., Disp: 90 tablet, Rfl: 3   hydroxychloroquine (PLAQUENIL) 200 MG tablet, Take 200 mg by mouth daily., Disp: , Rfl:    ocrelizumab (OCREVUS) 300 MG/10ML injection, as directed Intravenous, Disp: , Rfl:    chlorhexidine (PERIDEX) 0.12 % solution, SMARTSIG:By Mouth (Patient not taking: Reported on 09/11/2022), Disp: , Rfl:   PAST MEDICAL HISTORY: Past Medical History:  Diagnosis Date   Multiple sclerosis (HCC) 2015   RA (rheumatoid arthritis) (HCC) 1998    PAST SURGICAL HISTORY: Past Surgical History:  Procedure Laterality Date   APPENDECTOMY  2012  ELBOW DEBRIDEMENT Bilateral 1998   RA   INGUINAL HERNIA REPAIR      FAMILY HISTORY: Family History  Problem Relation Age of Onset   Heart disease Neg Hx     SOCIAL HISTORY:  Social History   Socioeconomic History   Marital status: Married    Spouse name: Janelle Floor   Number of children: 1   Years of education: Not on file   Highest education level: Bachelor's degree (e.g., BA, AB, BS)  Occupational History   Not on file  Tobacco Use   Smoking status: Never   Smokeless tobacco: Never  Substance and Sexual Activity   Alcohol use: Yes    Comment: Occasionally   Drug use: Never   Sexual activity: Not on file  Other Topics Concern   Not on file  Social History Narrative   Lives at home with wife and child-child due in Jan 2024   R handed   Caffeine: 1 C of coffee AM   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  No results found.    General: The patient is well-developed and well-nourished and in no acute distress  HEENT:  Head is [place]/AT.  Sclera are anicteric.   .  Skin: Extremities are without rash or  edema.  Musculoskeletal:  Back is nontender  Neurologic Exam  Mental status: The patient is alert and oriented x 3 at the time of the examination. The patient has apparent normal recent and remote memory, with an apparently normal attention span and concentration ability.   Speech is normal.  Cranial nerves: Extraocular movements are full.  He has a 2+ left APD.  Color vision is reduced out of the left eye.  It is normal OD.  Facial strength and  sensation were normal.  Trapezius and sternocleidomastoid strength is normal. No dysarthria is noted.   No obvious hearing deficits are noted.  Motor:  Muscle bulk is normal.   Tone is normal. Strength is  5 / 5 in all 4 extremities.   Sensory: Sensory testing is intact to pinprick, soft touch and vibration sensation in all 4 extremities.  Coordination: Cerebellar testing reveals good finger-nose-finger and heel-to-shin bilaterally.  Gait and station: Station is normal.   Gait is normal.  The tandem gait is wide.  His turn is mildly off balance... Romberg is negative.   Reflexes: Deep tendon reflexes are symmetric and normal bilaterally.        DIAGNOSTIC DATA (LABS, IMAGING, TESTING) - I reviewed patient records, labs, notes, testing and imaging myself where available.  Lab Results  Component Value Date   WBC 3.9 04/02/2022   HGB 12.8 (L) 04/02/2022   HCT 40.6 04/02/2022   MCV 89 04/02/2022   PLT 289 04/02/2022      Component Value Date/Time   NA 138 03/20/2022 0514   K 4.2 03/20/2022 0514   CL 103 03/20/2022 0514   CO2 27 03/20/2022 0514   GLUCOSE 130 (H) 03/20/2022 0514   BUN  11 03/20/2022 0514   CREATININE 0.96 03/20/2022 0514   CALCIUM 9.6 03/20/2022 0514   PROT  7.6 04/02/2022 0955   ALBUMIN 5.0 04/02/2022 0955   AST 43 (H) 04/02/2022 0955   ALT 31 04/02/2022 0955   ALKPHOS 63 04/02/2022 0955   BILITOT 0.3 04/02/2022 0955   GFRNONAA >60 03/20/2022 0514   Lab Results  Component Value Date   CHOL 196 04/05/2022   HDL 46.40 04/05/2022   LDLCALC 139 (H) 04/05/2022   TRIG 53.0 04/05/2022   CHOLHDL 4 04/05/2022   Lab Results  Component Value Date   HGBA1C 4.5 (L) 03/20/2022       ASSESSMENT AND PLAN  Multiple sclerosis (HCC) - Plan: CBC with Differential/Platelet, Comprehensive metabolic panel, IgG, IgA, IgM, CD20 B Cells  Rheumatoid arthritis, involving unspecified site, unspecified whether rheumatoid factor present (HCC) - Plan: CBC with Differential/Platelet, Comprehensive metabolic panel, IgG, IgA, IgM, CD20 B Cells  High risk medication use - Plan: CBC with Differential/Platelet, Comprehensive metabolic panel, IgG, IgA, IgM, CD20 B Cells  History of optic neuritis   Continue Ocrevus.   Check labs.   Next infusion is in August Wellbutrin XL for mood, focus.   Consider Provigil if no better after 2 months or not tolerated Rtc 6 months or sooner if new or worsening issues  This visit is part of a comprehensive longitudinal care medical relationship regarding the patients primary diagnosis of MS and related concerns.   Leiby Pigeon A. Epimenio Foot, MD, [facility] 04/22/2023, 3:01 PM Certified in Neurology, Clinical Neurophysiology, Sleep Medicine and Neuroimaging  [facility] [address] [place], [place] [address] [phone]

## 2023-04-24 LAB — COMPREHENSIVE METABOLIC PANEL
ALT: 14 IU/L (ref 0–44)
AST: 34 IU/L (ref 0–40)
Albumin: 4.8 g/dL (ref 4.1–5.1)
Alkaline Phosphatase: 65 IU/L (ref 44–121)
BUN/Creatinine Ratio: 14 (ref 9–20)
BUN: 12 mg/dL (ref 6–20)
Bilirubin Total: 0.4 mg/dL (ref 0.0–1.2)
CO2: 23 mmol/L (ref 20–29)
Calcium: 10 mg/dL (ref 8.7–10.2)
Chloride: 105 mmol/L (ref 96–106)
Creatinine, Ser: 0.87 mg/dL (ref 0.76–1.27)
Globulin, Total: 2.4 g/dL (ref 1.5–4.5)
Glucose: 75 mg/dL (ref 70–99)
Potassium: 4.1 mmol/L (ref 3.5–5.2)
Sodium: 143 mmol/L (ref 134–144)
Total Protein: 7.2 g/dL (ref 6.0–8.5)
eGFR: 118 mL/min/{1.73_m2} (ref >59)

## 2023-04-24 LAB — IGG, IGA, IGM
IgA/Immunoglobulin A, Serum: 433 mg/dL — ABNORMAL HIGH (ref 90–386)
IgG (Immunoglobin G), Serum: 993 mg/dL (ref 603–1613)
IgM (Immunoglobulin M), Srm: 54 mg/dL (ref 20–172)

## 2023-04-24 LAB — CBC WITH DIFFERENTIAL/PLATELET
Basophils Absolute: 0.1 10*3/uL (ref 0.0–0.2)
Basos: 2 %
EOS (ABSOLUTE): 0.1 10*3/uL (ref 0.0–0.4)
Eos: 3 %
Hematocrit: 37 % — ABNORMAL LOW (ref 37.5–51.0)
Hemoglobin: 12 g/dL — ABNORMAL LOW (ref 13.0–17.7)
Immature Grans (Abs): 0 10*3/uL (ref 0.0–0.1)
Immature Granulocytes: 0 %
Lymphocytes Absolute: 1.4 10*3/uL (ref 0.7–3.1)
Lymphs: 54 %
MCH: 28.1 pg (ref 26.6–33.0)
MCHC: 32.4 g/dL (ref 31.5–35.7)
MCV: 87 fL (ref 79–97)
Monocytes Absolute: 0.2 10*3/uL (ref 0.1–0.9)
Monocytes: 6 %
Neutrophils Absolute: 0.9 10*3/uL — ABNORMAL LOW (ref 1.4–7.0)
Neutrophils: 35 %
Platelets: 290 10*3/uL (ref 150–450)
RBC: 4.27 x10E6/uL (ref 4.14–5.80)
RDW: 13.6 % (ref 11.6–15.4)
WBC: 2.7 10*3/uL — ABNORMAL LOW (ref 3.4–10.8)

## 2023-04-24 LAB — CD20 B CELLS
% CD19-B Cells: 6.6 % (ref 4.6–22.1)
% CD20-B Cells: 6.4 % (ref 5.0–22.3)

## 2023-05-16 ENCOUNTER — Other Ambulatory Visit: Payer: Self-pay | Admitting: *Deleted

## 2023-05-16 MED ORDER — OCREVUS 300 MG/10ML IV SOLN: 600.0000 mg | INTRAVENOUS | 0 refills | Status: DC

## 2023-05-20 ENCOUNTER — Encounter: Payer: Self-pay | Admitting: Family Medicine

## 2023-05-20 ENCOUNTER — Ambulatory Visit (INDEPENDENT_AMBULATORY_CARE_PROVIDER_SITE_OTHER): Payer: Medicare Other | Admitting: Family Medicine

## 2023-05-20 VITALS — BP 120/70 | HR 68 | Temp 98.7°F | Resp 18 | Ht 72.0 in | Wt 151.1 lb

## 2023-05-20 DIAGNOSIS — M057A Rheumatoid arthritis with rheumatoid factor of other specified site without organ or systems involvement: Secondary | ICD-10-CM | POA: Diagnosis not present

## 2023-05-20 DIAGNOSIS — M25561 Pain in right knee: Secondary | ICD-10-CM

## 2023-05-20 DIAGNOSIS — G35 Multiple sclerosis: Secondary | ICD-10-CM

## 2023-05-20 DIAGNOSIS — Z0000 Encounter for general adult medical examination without abnormal findings: Secondary | ICD-10-CM

## 2023-05-20 DIAGNOSIS — M542 Cervicalgia: Secondary | ICD-10-CM | POA: Diagnosis not present

## 2023-05-20 NOTE — Patient Instructions (Addendum)
It was very nice to see you today!  [facility] at [facility] on the 1st floor Phone number [phone]    PLEASE NOTE:  If you had any lab tests please let us know if you have not heard back within a few days. You may see your results on MyChart before we have a chance to review them but we will give you a call once they are reviewed by [place]. If we ordered any referrals today, please let us know if you have not heard from their office within the next week.   Please try these tips to maintain a healthy lifestyle:  Eat most of your calories during the day when you are active. Eliminate processed foods including packaged sweets (pies, cakes, cookies), reduce intake of potatoes, white bread, white pasta, and white rice. Look for whole grain options, oat flour or almond flour.  Each meal should contain half fruits/vegetables, one quarter protein, and one quarter carbs (no bigger than a computer mouse).  Cut down on sweet beverages. This includes juice, soda, and sweet tea. Also watch fruit intake, though this is a healthier sweet option, it still contains natural sugar! Limit to 3 servings daily.  Drink at least 1 glass of water with each meal and aim for at least 8 glasses per day  Exercise at least 150 minutes every week.

## 2023-05-20 NOTE — Progress Notes (Signed)
Phone: [phone]   Subjective:  Patient 32 y.o. male presenting for annual physical.  Chief Complaint  Patient presents with   Annual Exam    CPE  Not fasting Right knee swelling start last week Possible pinched nerve in neck, pain started last week    Annual - Not exercising regularly. Recently moved to a new house.  Neck pain - Complains of intermittent right neck pain starting about 2 weeks ago. Pain tends to occur when picking things up, first time was when leaning over to pick up his baby. Last episode was 3 days ago. Episodes tend to last 10-15 minutes before improving. He states he is unable to turn his neck for the 10-15 minutes. He denies pain radiating down to shoulder or arm. No numbness/tingling.   Knee swelling - He reports right knee pain and swelling starting last week. Has been using a brace to help manage the pain/swelling. Has pain when walking without the brace. No known injuries. Follows up with rheumatology.   Mood - Was started on Wellbutrin due to difficulty focusing. First time taking Wellbutrin he felt very jittery and drowsy while at work. States he had to leave work early that day. The second time he did not have any symptoms, but has not wanted to take it again until he spoke to someone about his options.   Vertigo - Has episodes of vertigo. Avoids any quick positional changes.   See problem oriented charting- ROS- ROS: Gen: no fever, chills  Skin: no rash, itching ENT: no ear pain, ear drainage, nasal congestion, rhinorrhea, sinus pressure, sore throat Eyes: no blurry vision, double vision Resp: no cough, wheeze,SOB CV: no CP, palpitations, LE edema,  GI: no heartburn, n/v/d/c, abd pain GU: no dysuria, urgency, frequency, hematuria MSK: HPI Neuro: no dizziness, headache, weakness,  Psych: no depression, anxiety, insomnia, SI   The following were reviewed and entered/updated in epic: Past Medical History:  Diagnosis Date   Multiple sclerosis  (HCC) 2015   RA (rheumatoid arthritis) (HCC) 1998   Patient Active Problem List   Diagnosis Date Noted   High risk medication use 04/02/2022   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     leflunomide (ARAVA) 10 MG tablet Take 10 mg by mouth daily.     ocrelizumab (OCREVUS) 300 MG/10ML injection Inject 20 mLs (600 mg total) into the vein every 6 (six) months. 20 mL 0   No current facility-administered medications for this visit.    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 Right posterior neck muscles tight, swollen, and tender.  CARDIAC: RRR, S1S2+, no murmur. DP 2+B. + click LUNGS: CTAB. No wheezes ABDOMEN:  BS+, soft, NTND, No HSM, no masses EXT:  Right knee exam limited due to pt having tight jeans and brace on. Obvious swelling and palpable pockets of fluids. TTP joint  line.  MSK: no gross abnormalities. MS 5/5 all 4.  NEURO: A&O x3.  CN II-XII intact.  PSYCH: normal mood. Good eye contact   Pulse 68    Assessment and Plan   Health Maintenance counseling: 1. Anticipatory guidance: Patient counseled regarding regular dental exams q6 months, eye exams yearly, avoiding smoking and second hand smoke, limiting alcohol to 2 beverages per day.   2. Risk factor reduction:  Advised patient of need for regular exercise and diet rich in fruits and vegetables to reduce risk of heart attack and stroke. Exercise- Not exercising regularly.   Wt Readings from Last 3 Encounters:  05/20/23 151 lb 2 oz (68.5 kg)  04/22/23 148 lb 8 oz (67.4 kg)  09/11/22 149 lb (67.6 kg)   3. Immunizations/screenings/ancillary studies Immunization History  Administered Date(s) Administered   Hepatitis B, ADULT 06/08/2022, 07/09/2022, 12/11/2022   Tdap 04/05/2022   Health Maintenance Due  Topic Date Due   Medicare Annual Wellness (AWV)  Never done    4. Skin cancer screening- I advised regular sunscreen use. Denies worrisome, changing, or new skin lesions.  5. Smoking associated screening: non smoker  6. 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  Controlled.  Continue meds.  Reviewed labs.  Care per neuro 2.  RA-chronic.  ?affecting R knee-seeing rheum.  Continue 3.  R knee pain and swelling-new.  No injury.  ?RA flare, other.  To sports med 4. Cervicalgia-?pinched nerve, other.  Stretches.  To sports med 5.  Wellness-RHM UTD.  Recent labs from neuro.  Antic guidance  Recommended follow up: Return in about 1 year (around 05/19/2024) for annual physical.  Lab/Order associations: not fasting   I,Rachel Rivera,acting as a scribe for Angelena Sole, MD.,have documented all relevant documentation on the  behalf of Angelena Sole, MD,as directed by  Angelena Sole, MD while in the presence of Angelena Sole, MD.  I, Angelena Sole, MD, have reviewed all documentation for this visit. The documentation on 05/20/23 for the exam, diagnosis, procedures, and orders are all accurate and complete.   Angelena Sole, MD

## 2023-06-10 ENCOUNTER — Encounter: Payer: Self-pay | Admitting: *Deleted

## 2023-06-17 NOTE — Telephone Encounter (Signed)
  Per holly in infusion. Please advise on next steps if needed

## 2023-06-19 NOTE — Telephone Encounter (Signed)
Per Dr. Epimenio Foot  "  Since George Thomas is probably going to be a problem for him.  We could switch him over to Kesimpta if he would like.  It works the same way as Music therapist and is a self injection once a month"

## 2023-07-22 ENCOUNTER — Encounter: Payer: Self-pay | Admitting: Neurology

## 2023-07-22 ENCOUNTER — Other Ambulatory Visit: Payer: Self-pay | Admitting: Neurology

## 2023-07-22 MED ORDER — ARMODAFINIL 200 MG PO TABS
ORAL_TABLET | ORAL | 5 refills | Status: DC
Start: 2023-07-22 — End: 2023-09-04

## 2023-08-12 ENCOUNTER — Other Ambulatory Visit (HOSPITAL_COMMUNITY): Payer: Self-pay

## 2023-08-12 ENCOUNTER — Telehealth: Payer: Self-pay

## 2023-08-12 NOTE — Telephone Encounter (Signed)
Dr. Epimenio Foot- I see you mentioned it was for trouble focusing. Can you add dx code/dx? Thank you

## 2023-08-12 NOTE — Telephone Encounter (Signed)
*  [facility]  Prior Authorization form/request asks a question that requires your assistance. Please see the question below and advise accordingly   What diagnosis is the Armodafinil being prescribed for?    CMM Key:  [id number]

## 2023-08-13 NOTE — Telephone Encounter (Signed)
Submitted with diagnosis and pending determination.

## 2023-08-16 NOTE — Telephone Encounter (Signed)
Pharmacy Patient Advocate Encounter  Received notification from [facility] that Prior Authorization for Armodafinil 200MG  tablets has been DENIED.  Full denial letter will be uploaded to the media tab. See denial reason below.   PA #/Case ID/Reference #: PA Case ID #: [id number]

## 2023-09-04 ENCOUNTER — Other Ambulatory Visit: Payer: Self-pay | Admitting: Neurology

## 2023-09-04 ENCOUNTER — Encounter: Payer: Self-pay | Admitting: Neurology

## 2023-09-04 MED ORDER — AMPHETAMINE-DEXTROAMPHET ER 20 MG PO CP24: 20.0000 mg | ORAL_CAPSULE | Freq: Every day | ORAL | 0 refills | Status: DC

## 2023-09-04 NOTE — Telephone Encounter (Signed)
Called pt at [phone]. Relayed mychart message. He wants to know if Dr. Epimenio Foot can send in adderall instead for him to try since armodafinil denied. Aware I will send to Dr. Epimenio Foot.   Aware adderall may require PA as well.

## 2023-09-05 ENCOUNTER — Telehealth: Payer: Self-pay | Admitting: *Deleted

## 2023-09-05 NOTE — Telephone Encounter (Signed)
PA adderall needed, thank you

## 2023-09-06 ENCOUNTER — Other Ambulatory Visit (HOSPITAL_COMMUNITY): Payer: Self-pay

## 2023-09-06 ENCOUNTER — Telehealth: Payer: Self-pay

## 2023-09-06 NOTE — Telephone Encounter (Signed)
PA request has been Submitted. New Encounter created for follow up. For additional info see Pharmacy Prior Auth telephone encounter from 09/06/2023.

## 2023-09-06 NOTE — Telephone Encounter (Signed)
Pharmacy Patient Advocate Encounter   Received notification from Physician's Office that prior authorization for Amphetamine-Dextroamphet ER 20MG  er capsules is required/requested.   Insurance verification completed.   The patient is insured through [facility] .   Per test claim: PA required; PA submitted to above mentioned insurance via CoverMyMeds Key/confirmation #/EOC [id number] Status is pending

## 2023-09-10 NOTE — Telephone Encounter (Signed)
I spoke with the patient and let him know insurance is not approving adderall ER for his condition however he can use a good Rx coupon at [facility] for around $24. The patient was pleased with this option and thanked me for the call.

## 2023-09-10 NOTE — Telephone Encounter (Signed)
Pharmacy Patient Advocate Encounter  Received notification from [facility] that Prior Authorization for Amphetamine-Dextroamphet ER 20MG  er capsules has been DENIED.  Full denial letter will be uploaded to the media tab. See denial reason below.   PA #/Case ID/Reference #: PA Case ID #: [id number]

## 2023-09-30 ENCOUNTER — Ambulatory Visit: Payer: Medicare Other | Admitting: Neurology

## 2023-11-11 ENCOUNTER — Ambulatory Visit: Payer: Medicare Other | Admitting: Neurology

## 2023-11-11 ENCOUNTER — Telehealth: Payer: Self-pay | Admitting: Neurology

## 2023-11-11 NOTE — Telephone Encounter (Signed)
 Pt reschedule appointment due to icy roads

## 2024-01-06 ENCOUNTER — Ambulatory Visit: Payer: Medicaid Other

## 2024-02-06 ENCOUNTER — Encounter: Payer: Self-pay | Admitting: Neurology

## 2024-02-06 ENCOUNTER — Other Ambulatory Visit: Payer: Self-pay | Admitting: Neurology

## 2024-02-06 ENCOUNTER — Ambulatory Visit (INDEPENDENT_AMBULATORY_CARE_PROVIDER_SITE_OTHER): Payer: Medicare Other | Admitting: Neurology

## 2024-02-06 VITALS — BP 114/68 | HR 66 | Ht 72.0 in | Wt 157.5 lb

## 2024-02-06 DIAGNOSIS — G35 Multiple sclerosis: Secondary | ICD-10-CM | POA: Diagnosis not present

## 2024-02-06 DIAGNOSIS — R41840 Attention and concentration deficit: Secondary | ICD-10-CM

## 2024-02-06 DIAGNOSIS — Z8669 Personal history of other diseases of the nervous system and sense organs: Secondary | ICD-10-CM

## 2024-02-06 DIAGNOSIS — Z79899 Other long term (current) drug therapy: Secondary | ICD-10-CM

## 2024-02-06 DIAGNOSIS — M069 Rheumatoid arthritis, unspecified: Secondary | ICD-10-CM

## 2024-02-06 DIAGNOSIS — R260 Ataxic gait: Secondary | ICD-10-CM

## 2024-02-06 DIAGNOSIS — E559 Vitamin D deficiency, unspecified: Secondary | ICD-10-CM

## 2024-02-06 DIAGNOSIS — R5383 Other fatigue: Secondary | ICD-10-CM

## 2024-02-06 MED ORDER — TERIFLUNOMIDE 14 MG PO TABS: ORAL_TABLET | ORAL | 11 refills | Status: DC

## 2024-02-06 NOTE — Progress Notes (Signed)
 [facility]  PATIENT: George Thomas DOB: 10-18-1991  REFERRING DOCTOR OR PCP: Salli Real MD SOURCE: Patient, notes from 03/19/2022 emergency room visit and hospital admission, imaging and lab reports, MRI images personally reviewed.  _________________________________   HISTORICAL  CHIEF COMPLAINT:  Chief Complaint  Patient presents with   Follow-up    Pt in room 10. Alone.Here for MS follow up. DMT: Emogene Morgan. Pt last infusion was last year. Pt states he owes a fee. Pt reports doing well, last eye exam was last year, going to schedule exam. No concerns.     HISTORY OF PRESENT ILLNESS:  [name] is a 33 y.o. man with multiple sclerosis.  Update 02/06/2024 He started Ocrevus and tolerates it well   Hs next dose was supposed to be June 17, 2023 but he never got the infusion. He denies any exacerbation or new neurologic symptom.   Due to insurance difficulty, we also discussed a switch to [employer].    Gait is doing well and he keeps up with everyone.   He does not need the bannister on stairs but usually has it near the rail just in case. Good strength and sensation in limbs.    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   He notes some vertigo, especially after he moves certain ways  Bladder is doing well.    He reports fatigue.   He has trouble focusing especially for more complex or longer tasks.  He is often behind at work.  He never filled the Adderall.   Mood is ok, sometimes has apathy.  Besides MS, he has rheumatoid arthritis and is on Plaquenil 200 mg daily since 2020   He feels the RA is stable.  He sees Azucena Fallen, [address]     He noted fewer flare-ups since starting Ocrevus.   He has had some low WBC counts in past.      MS History  He had the onset of diplopia while driving in 1610.  When he woke up the next day, he had numbness in the left arm and  hand.   The next day, he was unable to see out of the left eye and went to the ED at [facility] and was admitted.   He had MRI's c/w MS.   CSF was also c/w MS.   He received steroids and had improvement, though colors are desaturated and acuity is not baseline OS.   Brain MRI with supratentorial and infratentorial lesions.  MRI at the time showed T2 plaques at C2, C3-C4, C5 and C7, T1, T2, T3/4, T9 and T11.     He was placed on and Gilenya (March 2015 through 2017) but had an exacerbation with leg symptoms and also had new MRI lesions in brain.  He was then on Tysabri but had tolerability issues (02/2016 to 12/2016) so stopped.  He was started on Aubagio in 2018 but had breakthrough activity.      He has been off of disease modifying therapy for the past several years since he moved to [place] in 2019 or 2020.  He has since returned.  He does note that he was not always compliant with his Gilenya.  However, he was compliant with his other medications.  He woke up 03/16/2022 with reduced hearing and tingling in his left hand and foot.   Symptoms persisted and on 03/19/2022 he went to  the emergency room.  He had no definite new lesion on brain and spine MRI's 03/19/2022 but he was admitted for IV Solumedrol.   Symptoms improved over the next few days.    He is now referred for DMT initiation. OCrevus was started in 2023 but last dose in 2024 due to insurance issues.  He was on [place] for a while in 2024 (has RA) and tolerated it well.      Imaging: MRI of the brain 03/19/2022 shows T2/FLAIR hyperintense foci in the periventricular, juxtacortical and deep white matter of both cerebral hemispheres and in the pons in a pattern consistent with chronic demyelinating plaque associated with multiple sclerosis.  None of the foci enhanced after contrast.  MRI of the cervical spine 03/19/2022 shows subtle T2 hyperintense foci within the spinal cord centrally and posteriorly adjacent to C2 and to the left adjacent to C3-C4.  Normal  enhancement pattern.  There is mild spinal stenosis at C5-C6 due to central disc protrusion but no nerve root compression.  I have MRI reports but no images from multiple other scans: 10/11/2017 MRI of the brain shows multiple T2/FLAIR hyperintense foci including 1 large frontal lobe focus on the right.  Optic nerves were atrophic.  There is no new lesions.  MRI of the cervical spine 10/11/2017 showed a new (compared to 02/03/2017) and enlarging hyperintense focus within the dorsal spinal cord at C3 C5 that did not enhance.  There were multiple other lesions in the cervical thoracic spine that had been seen on the previous MRIs.  MRI of the brain 12/31/2015 showed multiple T2/FLAIR hyperintense foci consistent with MS.  There were at least 3 new nonenhancing lesions compared to the MRI from 01/23/2015.  Laboratory: 03/20/2022: White blood cell count reduced at 1.6 (0.9 neutrophils and 0.6 lymphocytes); CMP unremarkable, HIV negative.   Reportedly, a recheck was still low late May 2023.     05/23/2018 [facility]): Hepatitis B panel, hep C antibody, HIV, TB serology were normal or unremarkable.   Anti-MOG and NMO negative      REVIEW OF SYSTEMS: Constitutional: No fevers, chills, sweats, or change in appetite Eyes: No visual changes, double vision, eye pain Ear, nose and throat: No hearing loss, ear pain, nasal congestion, sore throat Cardiovascular: No chest pain, palpitations Respiratory:  No shortness of breath at rest or with exertion.   No wheezes GastrointestinaI: No nausea, vomiting, diarrhea, abdominal pain, fecal incontinence Genitourinary:  No dysuria, urinary retention or frequency.  No nocturia. Musculoskeletal:  No neck pain, back pain Integumentary: No rash, pruritus, skin lesions Neurological: as above Psychiatric: No depression at this time.  No anxiety Endocrine: No palpitations, diaphoresis, change in appetite, change in weigh or increased thirst Hematologic/Lymphatic:  No anemia,  purpura, petechiae. Allergic/Immunologic: No itchy/runny eyes, nasal congestion, recent allergic reactions, rashes  ALLERGIES: No Known Allergies  HOME MEDICATIONS:  Current Outpatient Medications:    hydroxychloroquine (PLAQUENIL) 200 MG tablet, Take 200 mg by mouth daily., Disp: , Rfl:    Teriflunomide (AUBAGIO) 14 MG TABS, One po qd, Disp: 30 tablet, Rfl: 11   amphetamine-dextroamphetamine (ADDERALL XR) 20 MG 24 hr capsule, Take 1 capsule (20 mg total) by mouth daily. (Patient not taking: Reported on 02/06/2024), Disp: 30 capsule, Rfl: 0  PAST MEDICAL HISTORY: Past Medical History:  Diagnosis Date   Multiple sclerosis (HCC) 2015   RA (rheumatoid arthritis) (HCC) 1998    PAST SURGICAL HISTORY: Past Surgical History:  Procedure Laterality Date   APPENDECTOMY  2012  ELBOW DEBRIDEMENT Bilateral 1998   RA   INGUINAL HERNIA REPAIR      FAMILY HISTORY: Family History  Problem Relation Age of Onset   Heart disease Neg Hx     SOCIAL HISTORY:  Social History   Socioeconomic History   Marital status: Married    Spouse name: Janelle Floor   Number of children: 2   Years of education: Not on file   Highest education level: Bachelor's degree (e.g., BA, AB, BS)  Occupational History   Not on file  Tobacco Use   Smoking status: Never   Smokeless tobacco: Never  Vaping Use   Vaping status: Never Used  Substance and Sexual Activity   Alcohol use: Not Currently    Comment: Occasionally   Drug use: Never   Sexual activity: Not on file  Other Topics Concern   Not on file  Social History Narrative   Lives at home with wife and children   R handed   Caffeine: 1 C of coffee AM   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  No results found.    General: The patient is well-developed and well-nourished and in no acute distress  HEENT:  Head is [place]/AT.  Sclera are anicteric.   .  Skin: Extremities are without rash or  edema.  Musculoskeletal:  Back is nontender  Neurologic Exam  Mental status: The patient is alert and oriented x 3 at the time of the examination. The patient has apparent normal recent and remote memory, with an apparently normal attention span and concentration ability.   Speech is normal.  Cranial nerves: Extraocular movements are full.  He has a 2+ left APD.  Color vision is reduced out of the left eye.  It is normal OD.  Facial strength and  sensation were normal.  Trapezius and sternocleidomastoid strength is normal. No dysarthria is noted.   No obvious hearing deficits are noted.  Motor:  Muscle bulk is normal.   Tone is normal. Strength is  5 / 5 in all 4 extremities.   Sensory: Sensory testing is intact to pinprick, soft touch and vibration sensation in all 4 extremities.  Coordination: Cerebellar testing reveals good finger-nose-finger and heel-to-shin bilaterally.  Gait and station: Station is normal.   Gait is mildly wide.  Turn is stable.  .  The tandem gait is wide.  His turn is mildly off balance... Romberg is negative.   Reflexes: Deep tendon reflexes are symmetric and normal bilaterally.        DIAGNOSTIC DATA (LABS, IMAGING, TESTING) - I reviewed patient records, labs, notes, testing and imaging myself where available.  Lab Results  Component Value Date   WBC 2.7 (L) 04/22/2023   HGB 12.0 (L) 04/22/2023   HCT 37.0 (L) 04/22/2023   MCV 87 04/22/2023   PLT 290 04/22/2023      Component Value Date/Time   NA 143 04/22/2023 1513   K 4.1 04/22/2023 1513   CL 105 04/22/2023 1513  CO2 23 04/22/2023 1513   GLUCOSE 75  04/22/2023 1513   GLUCOSE 130 (H) 03/20/2022 0514   BUN 12 04/22/2023 1513   CREATININE 0.87 04/22/2023 1513   CALCIUM 10.0 04/22/2023 1513   PROT 7.2 04/22/2023 1513   ALBUMIN 4.8 04/22/2023 1513   AST 34 04/22/2023 1513   ALT 14 04/22/2023 1513   ALKPHOS 65 04/22/2023 1513   BILITOT 0.4 04/22/2023 1513   GFRNONAA >60 03/20/2022 0514   Lab Results  Component Value Date   CHOL 196 04/05/2022   HDL 46.40 04/05/2022   LDLCALC 139 (H) 04/05/2022   TRIG 53.0 04/05/2022   CHOLHDL 4 04/05/2022   Lab Results  Component Value Date   HGBA1C 4.5 (L) 03/20/2022       ASSESSMENT AND PLAN  Multiple sclerosis (HCC)  Rheumatoid arthritis, involving unspecified site, unspecified whether rheumatoid factor present (HCC)  High risk medication use  History of optic neuritis  Ataxic gait  Other fatigue  Attention or concentration deficit  Vitamin D deficiency   Due to insurance issues, we will switch to Aubagio (he has RA and was on Arava recently without issues).  We will check labs today and LFTs months x 5-6.   If breakthrough relapse or MRI activity, consider Kesimpta.   Check labs.   Next infusion is in August If ADD worsens, consider Adderall or Ritalin Rtc 6 months or sooner if new or worsening issues  This visit is part of a comprehensive longitudinal care medical relationship regarding the patients primary diagnosis of MS and related concerns.   Tamia Dial A. Epimenio Foot, MD, [facility] 02/06/2024, 10:35 AM Certified in Neurology, Clinical Neurophysiology, Sleep Medicine and Neuroimaging  [facility] [address], [address] [place], [place] [address] [phone]

## 2024-02-07 ENCOUNTER — Other Ambulatory Visit: Payer: Self-pay | Admitting: Neurology

## 2024-02-07 ENCOUNTER — Telehealth: Payer: Self-pay | Admitting: Neurology

## 2024-02-07 LAB — COMPREHENSIVE METABOLIC PANEL WITH GFR
ALT: 18 IU/L (ref 0–44)
AST: 36 IU/L (ref 0–40)
Albumin: 4.5 g/dL (ref 4.1–5.1)
Alkaline Phosphatase: 74 IU/L (ref 44–121)
BUN/Creatinine Ratio: 14 (ref 9–20)
BUN: 15 mg/dL (ref 6–20)
Bilirubin Total: 0.4 mg/dL (ref 0.0–1.2)
CO2: 25 mmol/L (ref 20–29)
Calcium: 9.6 mg/dL (ref 8.7–10.2)
Chloride: 105 mmol/L (ref 96–106)
Creatinine, Ser: 1.04 mg/dL (ref 0.76–1.27)
Globulin, Total: 2.7 g/dL (ref 1.5–4.5)
Glucose: 78 mg/dL (ref 70–99)
Potassium: 4.7 mmol/L (ref 3.5–5.2)
Sodium: 142 mmol/L (ref 134–144)
Total Protein: 7.2 g/dL (ref 6.0–8.5)
eGFR: 98 mL/min/{1.73_m2} (ref >59)

## 2024-02-07 LAB — CBC WITH DIFFERENTIAL/PLATELET
Basophils Absolute: 0.1 10*3/uL (ref 0.0–0.2)
Basos: 2 %
EOS (ABSOLUTE): 0.1 10*3/uL (ref 0.0–0.4)
Eos: 3 %
Hematocrit: 39.8 % (ref 37.5–51.0)
Hemoglobin: 12.7 g/dL — ABNORMAL LOW (ref 13.0–17.7)
Immature Grans (Abs): 0 10*3/uL (ref 0.0–0.1)
Immature Granulocytes: 0 %
Lymphocytes Absolute: 1.4 10*3/uL (ref 0.7–3.1)
Lymphs: 50 %
MCH: 27.7 pg (ref 26.6–33.0)
MCHC: 31.9 g/dL (ref 31.5–35.7)
MCV: 87 fL (ref 79–97)
Monocytes Absolute: 0.1 10*3/uL (ref 0.1–0.9)
Monocytes: 5 %
Neutrophils Absolute: 1.1 10*3/uL — ABNORMAL LOW (ref 1.4–7.0)
Neutrophils: 40 %
Platelets: 241 10*3/uL (ref 150–450)
RBC: 4.58 x10E6/uL (ref 4.14–5.80)
RDW: 13.8 % (ref 11.6–15.4)
WBC: 2.8 10*3/uL — ABNORMAL LOW (ref 3.4–10.8)

## 2024-02-07 LAB — VITAMIN D 25 HYDROXY (VIT D DEFICIENCY, FRACTURES): Vit D, 25-Hydroxy: 14.7 ng/mL — ABNORMAL LOW (ref 30.0–100.0)

## 2024-02-07 MED ORDER — VITAMIN D (ERGOCALCIFEROL) 1.25 MG (50000 UNIT) PO CAPS: 50000.0000 [IU] | ORAL_CAPSULE | ORAL | 1 refills | Status: DC

## 2024-02-07 NOTE — Telephone Encounter (Signed)
 no auth required sent to GI [phone]

## 2024-02-10 ENCOUNTER — Telehealth: Payer: Self-pay | Admitting: *Deleted

## 2024-02-10 ENCOUNTER — Encounter: Payer: Self-pay | Admitting: *Deleted

## 2024-02-10 NOTE — Telephone Encounter (Signed)
 Dr. Godwin Lat sent in terflunomide 02/06/24.

## 2024-02-10 NOTE — Telephone Encounter (Signed)
 [facility] was [facility] to [facility].

## 2024-02-19 NOTE — Telephone Encounter (Signed)
 [facility]. I spoke with Deatra Face. When running the teriflunomide  claim, their system tells them that patient cannot use his Medicare Part D coverage. It is unclear if patient is even covered currently under Medicare. When running the  Medicaid teriflunonide, the claim says that he needs to run Medicare first. Deatra Face will call the patient and ask for any other insurance information. She will call our office with any updates. This was a 12 minute phone call.

## 2024-03-04 NOTE — Telephone Encounter (Signed)
 I called patient to discuss if he has received his teriflunomide  and if so, tolerating it well.  No answer, left a message asking him to call us  back.  *When patient calls back, please confirm patient's insurance. It is unclear if he has Medicare coverage.*

## 2024-03-17 NOTE — Telephone Encounter (Signed)
 I called patient to discuss if he has received his teriflunomide  and if so, tolerating it well.  No answer, left a message asking him to call us  back.  *When patient calls back, please confirm patient's insurance. It is unclear if he has Medicare coverage.*

## 2024-04-06 NOTE — Telephone Encounter (Signed)
Letter placed in mail for patient.

## 2024-04-06 NOTE — Telephone Encounter (Signed)
 I called patient to discuss if he has received his teriflunomide  and if so, tolerating it well.   No answer, left a message asking him to call us  back.   *If patient calls back, please confirm patient's insurance. It is unclear if he has Medicare coverage.*  This is my third unsuccessful attempt at reaching patient via phone. He has also been unresponsive to the [employer] I sent. I will send him a letter in the mail asking him to call us  back.  It is unclear if he has received the teriflunomide  and what his current insurance coverage is.

## 2024-04-09 ENCOUNTER — Ambulatory Visit (INDEPENDENT_AMBULATORY_CARE_PROVIDER_SITE_OTHER): Admitting: Family

## 2024-04-09 ENCOUNTER — Encounter: Payer: Self-pay | Admitting: Family

## 2024-04-09 VITALS — BP 110/64 | HR 65 | Temp 99.1°F | Ht 72.0 in | Wt 155.8 lb

## 2024-04-09 DIAGNOSIS — M542 Cervicalgia: Secondary | ICD-10-CM

## 2024-04-09 NOTE — Progress Notes (Signed)
 Patient ID: George Thomas, male    DOB: 12-Feb-1991, 33 y.o.   MRN: [id number]  Chief Complaint  Patient presents with   Neck Pain    Started about 2, 2 1/2 weeks ago. Every morning stiffness and painful to look left and right, hard to check blind spot when driving. Trying aleve states it helps it a little. Has tried ice pack and neck massager nothing helping.   HPI: Neck pain:  Started about 2, 2 1/2 weeks ago. Every morning pt reports stiffness and has pain when he looks left and right, hard to check blind spot when driving. He denies any injury. 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 He has tried [employer] and states it helps for a short time. Has also tried ice packs and neck massager, but nothing is helping it to improve.  ASSESSMENT & PLAN:  1. Cervicalgia (Primary) Advised on stretches to do while working and handout provided to do at work and when at home. Advised on good ergonomics when working and home using phone or computer. Ok to continue Aleve, 1-2 tabs bid prn, apply heat before exercises and ice after for up to 20 minutes 2-3 times per day. Also can try OTC analgesic creams/patches like Tiger Balm or Biofreeze. Be sure neck is in good alignment while sleeping. Discussed using a firmer pillow. F/U if no improvement after PT.  - Ambulatory referral to Physical Therapy   Subjective:    Outpatient Medications Prior to Visit  Medication Sig Dispense Refill   amphetamine -dextroamphetamine (ADDERALL XR) 20 MG 24 hr capsule Take 1 capsule (20 mg total) by mouth daily. (Patient not taking: Reported on 04/09/2024) 30 capsule 0   hydroxychloroquine  (PLAQUENIL ) 200 MG tablet Take 200 mg by mouth daily. (Patient not taking: Reported on 04/09/2024)     Teriflunomide  (AUBAGIO ) 14 MG TABS One po qd (Patient not taking: Reported on 04/09/2024) 30 tablet 11   Vitamin D , Ergocalciferol , (DRISDOL ) 1.25 MG (50000 UNIT) CAPS capsule Take 1 capsule (50,000  Units total) by mouth every 7 (seven) days. (Patient not taking: Reported on 04/09/2024) 13 capsule 1   No facility-administered medications prior to visit.   Past Medical History:  Diagnosis Date   Multiple sclerosis (HCC) 2015   RA (rheumatoid arthritis) (HCC) 1998   Past Surgical History:  Procedure Laterality Date   APPENDECTOMY  2012   ELBOW DEBRIDEMENT Bilateral 1998   RA   INGUINAL HERNIA REPAIR     No Known Allergies    Objective:    Physical Exam Vitals and nursing note reviewed.  Constitutional:      General: He is not in acute distress.    Appearance: Normal appearance.  HENT:     Head: Normocephalic.   Cardiovascular:     Rate and Rhythm: Normal rate and regular rhythm.  Pulmonary:     Effort: Pulmonary effort is normal.     Breath sounds: Normal breath sounds.   Musculoskeletal:     Cervical back: No erythema, signs of trauma, rigidity, torticollis or crepitus. Pain with movement and muscular tenderness present. No spinous process tenderness. Decreased range of motion.   Skin:    General: Skin is warm and dry.   Neurological:     Mental Status: He is alert and oriented to person, place, and time.   Psychiatric:        Mood and Affect: Mood normal.    BP 110/64  Pulse 65   Temp 99.1 F (37.3 C) (Temporal)   Ht 6' (1.829 m)   Wt 155 lb 12.8 oz (70.7 kg)   SpO2 97%   BMI 21.13 kg/m  Wt Readings from Last 3 Encounters:  04/09/24 155 lb 12.8 oz (70.7 kg)  02/06/24 157 lb 8 oz (71.4 kg)  05/20/23 151 lb 2 oz (68.5 kg)       Versa Gore, NP

## 2024-05-25 ENCOUNTER — Encounter: Payer: Medicare Other | Admitting: Family Medicine

## 2024-09-01 ENCOUNTER — Encounter: Payer: Self-pay | Admitting: Family Medicine

## 2024-09-01 ENCOUNTER — Ambulatory Visit: Admitting: Family Medicine

## 2024-09-01 VITALS — BP 114/62 | HR 74 | Temp 99.0°F | Ht 72.0 in | Wt 150.1 lb

## 2024-09-01 DIAGNOSIS — G35D Multiple sclerosis, unspecified: Secondary | ICD-10-CM

## 2024-09-01 DIAGNOSIS — G8929 Other chronic pain: Secondary | ICD-10-CM

## 2024-09-01 DIAGNOSIS — M25511 Pain in right shoulder: Secondary | ICD-10-CM | POA: Diagnosis not present

## 2024-09-01 DIAGNOSIS — M05721 Rheumatoid arthritis with rheumatoid factor of right elbow without organ or systems involvement: Secondary | ICD-10-CM | POA: Diagnosis not present

## 2024-09-01 DIAGNOSIS — M05722 Rheumatoid arthritis with rheumatoid factor of left elbow without organ or systems involvement: Secondary | ICD-10-CM

## 2024-09-01 NOTE — Patient Instructions (Signed)
 It was very nice to see you today!  [facility]  [address] on the 1st floor Phone number [phone]    PLEASE NOTE:  If you had any lab tests please let us know if you have not heard back within a few days. You may see your results on MyChart before we have a chance to review them but we will give you a call once they are reviewed by [place]. If we ordered any referrals today, please let us know if you have not heard from their office within the next week.   Please try these tips to maintain a healthy lifestyle:  Eat most of your calories during the day when you are active. Eliminate processed foods including packaged sweets (pies, cakes, cookies), reduce intake of potatoes, white bread, white pasta, and white rice. Look for whole grain options, oat flour or almond flour.  Each meal should contain half fruits/vegetables, one quarter protein, and one quarter carbs (no bigger than a computer mouse).  Cut down on sweet beverages. This includes juice, soda, and sweet tea. Also watch fruit intake, though this is a healthier sweet option, it still contains natural sugar! Limit to 3 servings daily.  Drink at least 1 glass of water with each meal and aim for at least 8 glasses per day  Exercise at least 150 minutes every week.

## 2024-09-01 NOTE — Progress Notes (Signed)
 Subjective:     Patient ID: George Thomas, male    DOB: 1991-04-06, 33 y.o.   MRN: [id number]  Chief Complaint  Patient presents with   Establish Care    Pt is here today to establish care and also discuss Right shoulder    Discussed the use of AI scribe software for clinical note transcription with the patient, who gave verbal consent to proceed.  History of Present Illness George Thomas is a 33 year old male with rheumatoid arthritis and MS who presents with right shoulder pain.  He is not a new pt  He has been experiencing persistent right shoulder pain since 2022 following an injury during clay target shooting. The initial injury felt like a separation in the shoulder, leading to weeks of pain. The pain has been intermittent, with episodes of swelling and reduced range of motion. It is exacerbated by lifting and associated with weakness more d/t pain, causing him to avoid using his right arm for heavy lifting. He predominantly uses his left arm for activities such as picking up his son. No N/T  He has not undergone physical therapy for the shoulder injury and was not under the care of a rheumatologist at the time of the injury as he was living in [place] . Prednisone  has been prescribed, which helps reduce swelling and improve function temporarily, but he is not on any regular medication for this issue.  He has a history of rheumatoid arthritis affecting his elbows and knees since childhood, with a positive rheumatoid factor. He is not currently taking any medications for rheumatoid arthritis and is needing to change his rheumatologist.  He also has a history of multiple sclerosis and is under the care of Dr. Vear for this condition. He is due for a follow-up appointment with his neurologist. He is not currently on any medications for multiple sclerosis.  He mentions a past neck injury related to a workout, which caused soreness and swelling, but this is not a current  concern. He has been staying active and was working out regularly until the shoulder pain limited his activities.    There are no preventive care reminders to display for this patient.  Past Medical History:  Diagnosis Date   Multiple sclerosis 2015   RA (rheumatoid arthritis) (HCC) 1998    Past Surgical History:  Procedure Laterality Date   APPENDECTOMY  2012   ELBOW DEBRIDEMENT Bilateral 1998   RA   INGUINAL HERNIA REPAIR      No current outpatient medications on file.  No Known Allergies ROS neg/noncontributory except as noted HPI/below      Objective:     BP 114/62 (BP Location: Left Arm, Patient Position: Sitting, Cuff Size: Normal)   Pulse 74   Temp 99 F (37.2 C) (Temporal)   Ht 6' (1.829 m)   Wt 150 lb 2 oz (68.1 kg)   SpO2 97%   BMI 20.36 kg/m  Wt Readings from Last 3 Encounters:  09/01/24 150 lb 2 oz (68.1 kg)  04/09/24 155 lb 12.8 oz (70.7 kg)  02/06/24 157 lb 8 oz (71.4 kg)    Physical Exam GENERAL: Well developed, well nourished, no acute distress. HEAD EYES EARS NOSE THROAT: Normocephalic, atraumatic, conjunctiva not injected, sclera nonicteric. CARDIAC: Regular rate and rhythm, S1 S2 present, no murmur, NECK: Supple, no thyromegaly, no nodes, no carotid bruits, tightness in right posterior shoulder. LUNGS: Clear to auscultation bilaterally, no wheezes. EXTREMITIES: No edema. MUSCULOSKELETAL: No gross abnormalities, right shoulder  with limited abduction and adduction due to pain, push up and rotation limited by pain. More rom w/assist.  MS 5/5 LUE, 4-4+ on R NEUROLOGICAL: Alert and oriented x3, cranial nerves II through XII intact. PSYCHIATRIC: Normal mood, good eye contact.       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 The pain is intermittent, with episodes of swelling and significant limitation in abduction and adduction and rotation. Lifting and certain movements exacerbate the pain, leading to functional limitations. Differential diagnosis includes rotator cuff injury or other shoulder pathology. Prednisone  has previously provided temporary relief by reducing swelling. Refer to a sports medicine specialist for evaluation and possible ultrasound of the shoulder to assess the rotator cuff and other structures. Consider ultrasound-guided injection if deemed appropriate by the specialist. Allow the specialist to determine the need for physical therapy based on evaluation findings.  Rheumatoid arthritis with rheumatoid factor, multiple joints   He has rheumatoid arthritis with a positive rheumatoid factor affecting multiple joints, including elbows and knees. There is no current medication regimen for rheumatoid arthritis. Previous management involved collaboration between rheumatology and neurology due to overlapping symptoms with multiple sclerosis. Update insurance information to facilitate the referral process. Refer to rheumatology for further management and potential medication adjustments.     Return for annual physical-whenever.  Jenkins CHRISTELLA Carrel, MD

## 2024-09-14 ENCOUNTER — Ambulatory Visit: Admitting: Neurology

## 2024-09-14 ENCOUNTER — Encounter: Payer: Self-pay | Admitting: Neurology

## 2024-12-07 ENCOUNTER — Encounter: Admitting: Family Medicine

## 2024-12-28 ENCOUNTER — Ambulatory Visit
